# Patient Record
Sex: Female | Born: 1975 | Race: White | Hispanic: No | Marital: Single | Smoking: Never smoker
Health system: Southern US, Community
[De-identification: ages and names within clinical notes are randomized; demographics above are authoritative.]

## PROBLEM LIST (undated history)

## (undated) DIAGNOSIS — M1990 Unspecified osteoarthritis, unspecified site: Secondary | ICD-10-CM

## (undated) DIAGNOSIS — F53 Postpartum depression: Secondary | ICD-10-CM

## (undated) DIAGNOSIS — O99345 Other mental disorders complicating the puerperium: Secondary | ICD-10-CM

## (undated) DIAGNOSIS — A4902 Methicillin resistant Staphylococcus aureus infection, unspecified site: Secondary | ICD-10-CM

## (undated) DIAGNOSIS — R519 Headache, unspecified: Secondary | ICD-10-CM

## (undated) DIAGNOSIS — R51 Headache: Secondary | ICD-10-CM

## (undated) DIAGNOSIS — D649 Anemia, unspecified: Secondary | ICD-10-CM

## (undated) DIAGNOSIS — R569 Unspecified convulsions: Secondary | ICD-10-CM

## (undated) HISTORY — PX: LIPOSUCTION: SHX10

## (undated) HISTORY — PX: KNEE SURGERY: SHX244

## (undated) HISTORY — PX: TUBAL LIGATION: SHX77

---

## 1998-08-20 ENCOUNTER — Ambulatory Visit (HOSPITAL_COMMUNITY): Admission: RE | Admit: 1998-08-20 | Discharge: 1998-08-20 | Payer: Self-pay | Admitting: Neurology

## 1998-08-20 ENCOUNTER — Encounter: Payer: Self-pay | Admitting: Neurology

## 1999-10-11 ENCOUNTER — Encounter: Payer: Self-pay | Admitting: Emergency Medicine

## 1999-10-11 ENCOUNTER — Emergency Department (HOSPITAL_COMMUNITY): Admission: EM | Admit: 1999-10-11 | Discharge: 1999-10-11 | Payer: Self-pay

## 2000-09-24 ENCOUNTER — Encounter: Admission: RE | Admit: 2000-09-24 | Discharge: 2000-09-24 | Payer: Self-pay | Admitting: Otolaryngology

## 2000-09-24 ENCOUNTER — Encounter: Payer: Self-pay | Admitting: Otolaryngology

## 2001-08-11 ENCOUNTER — Emergency Department (HOSPITAL_COMMUNITY): Admission: EM | Admit: 2001-08-11 | Discharge: 2001-08-11 | Payer: Self-pay | Admitting: Emergency Medicine

## 2001-08-11 ENCOUNTER — Encounter: Payer: Self-pay | Admitting: Emergency Medicine

## 2001-08-13 ENCOUNTER — Inpatient Hospital Stay (HOSPITAL_COMMUNITY): Admission: AD | Admit: 2001-08-13 | Discharge: 2001-08-13 | Payer: Self-pay | Admitting: *Deleted

## 2001-09-02 ENCOUNTER — Observation Stay (HOSPITAL_COMMUNITY): Admission: AD | Admit: 2001-09-02 | Discharge: 2001-09-02 | Payer: Self-pay | Admitting: Obstetrics and Gynecology

## 2001-09-17 ENCOUNTER — Observation Stay (HOSPITAL_COMMUNITY): Admission: AD | Admit: 2001-09-17 | Discharge: 2001-09-17 | Payer: Self-pay | Admitting: Obstetrics & Gynecology

## 2001-09-18 ENCOUNTER — Other Ambulatory Visit: Admission: RE | Admit: 2001-09-18 | Discharge: 2001-09-18 | Payer: Self-pay | Admitting: Obstetrics and Gynecology

## 2001-09-18 ENCOUNTER — Observation Stay (HOSPITAL_COMMUNITY): Admission: AD | Admit: 2001-09-18 | Discharge: 2001-09-19 | Payer: Self-pay | Admitting: Obstetrics and Gynecology

## 2001-11-22 ENCOUNTER — Ambulatory Visit (HOSPITAL_COMMUNITY): Admission: RE | Admit: 2001-11-22 | Discharge: 2001-11-22 | Payer: Self-pay | Admitting: Obstetrics and Gynecology

## 2001-12-03 ENCOUNTER — Encounter: Payer: Self-pay | Admitting: Obstetrics and Gynecology

## 2001-12-03 ENCOUNTER — Inpatient Hospital Stay (HOSPITAL_COMMUNITY): Admission: AD | Admit: 2001-12-03 | Discharge: 2001-12-03 | Payer: Self-pay | Admitting: Obstetrics and Gynecology

## 2002-01-10 ENCOUNTER — Inpatient Hospital Stay (HOSPITAL_COMMUNITY): Admission: AD | Admit: 2002-01-10 | Discharge: 2002-01-10 | Payer: Self-pay | Admitting: Obstetrics and Gynecology

## 2002-01-10 ENCOUNTER — Encounter: Payer: Self-pay | Admitting: Obstetrics and Gynecology

## 2002-01-17 ENCOUNTER — Ambulatory Visit (HOSPITAL_COMMUNITY): Admission: RE | Admit: 2002-01-17 | Discharge: 2002-01-17 | Payer: Self-pay | Admitting: Obstetrics and Gynecology

## 2002-01-18 ENCOUNTER — Inpatient Hospital Stay (HOSPITAL_COMMUNITY): Admission: AD | Admit: 2002-01-18 | Discharge: 2002-01-18 | Payer: Self-pay | Admitting: Obstetrics & Gynecology

## 2002-02-18 ENCOUNTER — Inpatient Hospital Stay (HOSPITAL_COMMUNITY): Admission: AD | Admit: 2002-02-18 | Discharge: 2002-02-20 | Payer: Self-pay | Admitting: *Deleted

## 2002-02-19 ENCOUNTER — Encounter: Payer: Self-pay | Admitting: *Deleted

## 2002-02-21 ENCOUNTER — Inpatient Hospital Stay (HOSPITAL_COMMUNITY): Admission: AD | Admit: 2002-02-21 | Discharge: 2002-02-24 | Payer: Self-pay | Admitting: Obstetrics and Gynecology

## 2002-02-21 ENCOUNTER — Encounter: Payer: Self-pay | Admitting: Obstetrics and Gynecology

## 2002-02-22 ENCOUNTER — Encounter: Payer: Self-pay | Admitting: Obstetrics and Gynecology

## 2002-02-22 ENCOUNTER — Encounter: Payer: Self-pay | Admitting: Cardiology

## 2002-02-24 ENCOUNTER — Encounter: Payer: Self-pay | Admitting: Obstetrics and Gynecology

## 2002-03-01 ENCOUNTER — Inpatient Hospital Stay (HOSPITAL_COMMUNITY): Admission: AD | Admit: 2002-03-01 | Discharge: 2002-03-01 | Payer: Self-pay | Admitting: Obstetrics & Gynecology

## 2002-03-03 ENCOUNTER — Inpatient Hospital Stay (HOSPITAL_COMMUNITY): Admission: AD | Admit: 2002-03-03 | Discharge: 2002-03-03 | Payer: Self-pay | Admitting: *Deleted

## 2002-03-05 ENCOUNTER — Encounter (INDEPENDENT_AMBULATORY_CARE_PROVIDER_SITE_OTHER): Payer: Self-pay | Admitting: Specialist

## 2002-03-05 ENCOUNTER — Inpatient Hospital Stay (HOSPITAL_COMMUNITY): Admission: AD | Admit: 2002-03-05 | Discharge: 2002-03-07 | Payer: Self-pay | Admitting: Obstetrics and Gynecology

## 2002-03-09 ENCOUNTER — Encounter: Admission: RE | Admit: 2002-03-09 | Discharge: 2002-04-08 | Payer: Self-pay | Admitting: Obstetrics and Gynecology

## 2002-04-07 ENCOUNTER — Other Ambulatory Visit: Admission: RE | Admit: 2002-04-07 | Discharge: 2002-04-07 | Payer: Self-pay | Admitting: Obstetrics and Gynecology

## 2002-10-30 ENCOUNTER — Emergency Department (HOSPITAL_COMMUNITY): Admission: EM | Admit: 2002-10-30 | Discharge: 2002-10-30 | Payer: Self-pay | Admitting: Emergency Medicine

## 2004-09-04 ENCOUNTER — Ambulatory Visit (HOSPITAL_COMMUNITY): Admission: RE | Admit: 2004-09-04 | Discharge: 2004-09-04 | Payer: Self-pay | Admitting: *Deleted

## 2005-08-17 ENCOUNTER — Encounter (INDEPENDENT_AMBULATORY_CARE_PROVIDER_SITE_OTHER): Payer: Self-pay | Admitting: *Deleted

## 2005-08-17 ENCOUNTER — Ambulatory Visit (HOSPITAL_COMMUNITY): Admission: RE | Admit: 2005-08-17 | Discharge: 2005-08-17 | Payer: Self-pay | Admitting: Obstetrics and Gynecology

## 2006-01-11 ENCOUNTER — Emergency Department (HOSPITAL_COMMUNITY): Admission: EM | Admit: 2006-01-11 | Discharge: 2006-01-12 | Payer: Self-pay | Admitting: Emergency Medicine

## 2007-10-29 ENCOUNTER — Ambulatory Visit: Payer: Self-pay | Admitting: Family Medicine

## 2008-02-23 ENCOUNTER — Ambulatory Visit: Payer: Self-pay | Admitting: Internal Medicine

## 2008-04-14 ENCOUNTER — Encounter: Admission: RE | Admit: 2008-04-14 | Discharge: 2008-04-14 | Payer: Self-pay | Admitting: Psychiatry

## 2009-01-24 ENCOUNTER — Ambulatory Visit: Payer: Self-pay | Admitting: Internal Medicine

## 2009-04-25 ENCOUNTER — Ambulatory Visit: Payer: Self-pay | Admitting: Internal Medicine

## 2009-06-06 ENCOUNTER — Ambulatory Visit: Payer: Self-pay | Admitting: Internal Medicine

## 2009-08-30 ENCOUNTER — Encounter: Payer: Self-pay | Admitting: Maternal and Fetal Medicine

## 2009-09-16 ENCOUNTER — Encounter: Payer: Self-pay | Admitting: Maternal & Fetal Medicine

## 2009-11-13 ENCOUNTER — Ambulatory Visit: Payer: Self-pay | Admitting: Internal Medicine

## 2009-12-16 ENCOUNTER — Observation Stay: Payer: Self-pay

## 2009-12-22 ENCOUNTER — Observation Stay: Payer: Self-pay

## 2010-01-13 ENCOUNTER — Observation Stay: Payer: Self-pay | Admitting: Obstetrics and Gynecology

## 2010-01-30 ENCOUNTER — Encounter: Payer: Self-pay | Admitting: Family Medicine

## 2010-04-29 ENCOUNTER — Ambulatory Visit: Payer: Self-pay | Admitting: Internal Medicine

## 2010-05-20 ENCOUNTER — Ambulatory Visit: Payer: Self-pay | Admitting: Ophthalmology

## 2010-05-27 NOTE — H&P (Signed)
NAME:  Alison Maxwell, Alison Maxwell                             ACCOUNT NO.:  [id number]   MEDICAL RECORD NO.:  [id number]                   PATIENT TYPE:  OBV   LOCATION:  9320                                 [facility]ILITY:  [facility]   PHYSICIAN:  Guy Sandifer. Arleta Creek, M.D.           DATE OF BIRTH:  [birth year]   DATE OF ADMISSION:  09/18/2001  DATE OF DISCHARGE:                                HISTORY & PHYSICAL   CHIEF COMPLAINT:  Hyperemesis gravidarum.   HISTORY OF PRESENT ILLNESS:  This patient is a 35 year old married white  female, G3, P0, AB2, with an LMP of  July 05, 2001, and an [facility] of April 12, 2002, confirmed by a 6-1/2 week ultrasound, placing her at 11-0/7 weeks  estimated gestational age.  She has had persistent problems with nausea and  vomiting this pregnancy.  This has been refractory to Reglan, Phenergan, and  Zofran.  She had heavy diarrhea last evening which required her second round  of IV fluids.  So far today she has still not retained any oral intake,  including fluids or food.  She is being admitted for management with IV  fluids and antiemetics.   PAST MEDICAL HISTORY:  Abnormal Pap, 1997.   PAST SURGICAL HISTORY:  1. Knee surgery.  2. Liposuction.   OBSTETRIC HISTORY:  D&C x2.   FAMILY HISTORY:  Positive for hypertension in father, renal disease in  maternal grandfather and mother, lung and testicular cancer in father.   SOCIAL HISTORY:  The patient denies tobacco, alcohol, or drug abuse.   REVIEW OF SYSTEMS:  Negative except as above.   PHYSICAL EXAMINATION:  VITAL SIGNS:  Height 5 feet 1 inch, weight 138-1/2  pounds, blood pressure 94/64.  HEENT:  Without thyromegaly.  LUNGS:  Clear to auscultation.  HEART:  Regular rate and rhythm.  BACK:  Without CVA tenderness.  BREASTS:  Not examined.  ABDOMEN:  Soft, nontender without masses.  PELVIC:  Vulva, vagina, and cervix without lesions.  Uterus is 10-12 weeks  in size.  Adnexa nontender without masses.  Cervix is closed,  thick, and  high.  Fetal heart tones are auscultated.  EXTREMITIES/NEUROLOGIC:  Grossly within normal limits.   LABORATORY DATA:  Blood type is O negative.  Rh antibody screen is positive  for anti-D, status post RhoGAM.  VDRL nonreactive.  Rubella titer immune.  Hepatitis B surface antigen negative.    ASSESSMENT:  Hyperemesis gravidarum at 11 weeks' estimated gestational age.   PLAN:  IV fluids, antiemetics p.r.n.  Reevaluation in a.m.                                                 Guy Sandifer Arleta Creek, M.D.    JET/MEDQ  D:  09/18/2001  T:  09/18/2001  Job:  [id number]

## 2010-05-27 NOTE — Op Note (Signed)
   NAME:  Maxwell, Alison STRINE                           ACCOUNT NO.:  [id number]   MEDICAL RECORD NO.:  [id number]                   PATIENT TYPE:  INP   LOCATION:  9325                                 [facility]   PHYSICIAN:  Guy Sandifer. Arleta Creek, M.D.           DATE OF BIRTH:  [birth year]   DATE OF PROCEDURE:  03/05/2002  DATE OF DISCHARGE:                                 OPERATIVE REPORT   PROCEDURE:  Resection of vulvar lesions.   INDICATIONS AND CONSENT:  This patient is a 35 year old married white female  who is in active labor.  She has two condylomatous-type lesions measuring 8-  12 mm at the 6 and 8 o'clock positions at the vaginal introitus.  After  discussing with the patient, she would like these taken off.   DESCRIPTION OF PROCEDURE:  The patient has epidural anesthesia in for  delivery.  She is presently pushing, and the baby is beginning to crown.  She has been prepped in a sterile fashion for delivery.  Simple resection  with the scissors is used to remove these lesions, which are sent to  pathology.                                               Guy Sandifer Arleta Creek, M.D.    JET/MEDQ  D:  03/05/2002  T:  03/06/2002  Job:  [id number]

## 2010-05-27 NOTE — Op Note (Signed)
NAME:  Alison Maxwell, Alison Maxwell                 ACCOUNT NO.:  [id number]   MEDICAL RECORD NO.:  [id number]          PATIENT TYPE:  AMB   LOCATION:  SDC                           [facility]   PHYSICIAN:  Malva Limes, M.D.    DATE OF BIRTH:  18-Dec-1975   DATE OF PROCEDURE:  08/17/2005  DATE OF DISCHARGE:                                 OPERATIVE REPORT   PREOPERATIVE DIAGNOSIS:  Spontaneous abortion.   POSTOPERATIVE DIAGNOSIS:  Spontaneous abortion.   PROCEDURE:  Dilatation and curettage.   SURGEON:  Malva Limes, M.D.   ANESTHESIA:  MAC with paracervical block.   DRAINS:  None.   ANTIBIOTICS:  Ancef 1 gram.   ESTIMATED BLOOD LOSS:  30 mL.   SPECIMEN:  Products of conception sent to pathology.   COMPLICATIONS:  None.   PROCEDURE:  The patient was taken to the operating room where she was placed  in dorsal lithotomy position.  MAC anesthesia was administered without  complications.  She was then prepped with Betadine and draped in the usual  fashion for this procedure.  A sterile speculum was placed in the vagina.  Twenty mL of 1% lidocaine was used for paracervical block.  The patient then  had her cervix serially dilated.  An 8 mm suction cannula was placed into  the uterine cavity and products of conception were withdrawn.  Sharp  curettage was then performed followed by repeat suction.  The patient  tolerated the procedure well.  She will be discharged to home.  She will be  given RhoGAM prior to discharge.  The patient will be given Darvocet to take  p.r.n.           ______________________________  Malva Limes, M.D.     MA/MEDQ  D:  08/17/2005  T:  08/17/2005  Job:  [id number]

## 2010-05-27 NOTE — Discharge Summary (Signed)
NAME:  Alison Maxwell, Alison Maxwell                           ACCOUNT NO.:  [id number]   MEDICAL RECORD NO.:  [id number]                   PATIENT TYPE:  INP   LOCATION:  9128                                 [facility]   PHYSICIAN:  Duke Salvia. Marcelle Overlie, M.D.            DATE OF BIRTH:  12-10-1975   DATE OF ADMISSION:  02/21/2002  DATE OF DISCHARGE:  02/24/2002                                 DISCHARGE SUMMARY   DISCHARGE DIAGNOSES:  1. A 33-week intrauterine pregnancy.  2. Preterm labor.  3. Pulmonary edema.   HISTORY OF PRESENT ILLNESS:  For summary of the admission history and  physical examination, please see admission H&P for details.  Briefly, a 24-  year-old G3, P0, A2, EDD April 3 at 33 weeks previously hospitalized for  preterm labor.  Treated with magnesium sulfate and steroids IM.  She was  discharged on the day prior to admission on oral terbutaline.  Presents now  with shortness of breath and contractions.  Chest x-ray showed mild  cardiomegaly with vascular congestion and small pleural effusion.  Admitted  now to treat these symptoms.   HOSPITAL COURSE:  On initial evaluation in triage was noted to be 2 cm, 50%,  vertex.  ABG showed O2 86 on room air, CO2 24.  An IV was started and she  was given IV Lasix with good response in urine output and improved  respiratory rate.  Was noted on February 14 to have mild elevation of SGOT  and SGPT.  She received incentive spirometry and was on IV Unasyn.  She had  improvement in her breathing after several doses of Lasix.  Admission WBC  was 17.3.  At discharge on February 16 WBC was 9.0.  February 13 the  admission chest x-ray showed vascular congestion with small effusions and  lower lobe atelectasis.  On February 14 it was read as no significant change  with mild edema.   OB ultrasound on February 11 showed a single vertex fetus.  Cervical length  was noted to be shortened.  Amniotic fluid volume was normal.  Cervical  length was 2.4  cm.  Cardiac echocardiogram was ordered on February 14.  This  was read as normal with normal left ventricular function.  Repeat laboratory  work on February 15 WBC 14,600, platelets 164,000, SGOT 31, SGPT 34 both of  which were normal.  Pulmonary examination was much improved at that point  and her breathing was clinically improved also.  She had resolution of her  preterm labor with only rare contractions.   By February 16 lungs were clear.  She had slight decreased breath sounds in  the right lower lobe.  Liver function tests repeat were normal.  Follow-up  chest films showed near complete resolution of bilateral pleural effusions  and bibasilar pulmonary opacities since prior study.   Other laboratory data:  CBC on discharge February 16:  WBC  9.0, hemoglobin  11.3, hematocrit 33.0, platelet count 257,000.  CMET on admission sodium  134, potassium 3.4, BUN 4.  AST 56, ALT 41.  CMET on discharge sodium 132,  BUN 5, calcium 8.2, AST 21, ALT 26 which were normal.  RPR was nonreactive.   DISPOSITION:  The patient is discharged on modified bed rest at home.  Advised to report any fever of 101, increased difficulty with shortness of  breath or more than four to five contractions per hour.  Preterm labor signs  and symptoms were reviewed.  She will return to our office in two days for  follow-up evaluation.    CONDITION ON DISCHARGE:  Good.   ACTIVITY:  Modified bed rest at home.                                               Richard M. Marcelle Overlie, M.D.    RMH/MEDQ  D:  04/30/2002  T:  04/30/2002  Job:  [id number]

## 2010-05-27 NOTE — H&P (Signed)
   NAME:  [name], [name]                           ACCOUNT NO.:  [id number]   MEDICAL RECORD NO.:  [id number]                   PATIENT TYPE:  INP   LOCATION:  9152                                 [facility]   PHYSICIAN:  Tracie Harrier, M.D.              DATE OF BIRTH:  [birth year]   DATE OF ADMISSION:  02/18/2002  DATE OF DISCHARGE:                                HISTORY & PHYSICAL   HISTORY OF PRESENT ILLNESS:  [name] is a 35 year old female, G3, P0, A2,  at 40 and 3/7ths weeks gestation.  She was admitted after being seen in  maternity admissions for increased contractions.  The patient was treated  initially conservatively with subcutaneous terbutaline and p.o. hydration.  This was unsuccessful.  She was dilated 1-2 cm, 50%, per nurse examiner.  She is admitted for magnesium intravenous tocolysis.   Her pregnancy has been complicated by preterm labor which has been treated  with bed rest.   OB LABS:  Maternal blood type O-, rubella immune, glucola normal, group B  strep unknown.   MEDICAL HISTORY:  None.   SURGICAL HISTORY:  1. Knee surgery.  2. Liposuction.  3. D&C x1.   OB HISTORY:  SAB x2.   CURRENT MEDICATIONS:  Prenatal vitamins.   ALLERGIES:  ERYTHROMYCIN AND SULFA.   SOCIAL HISTORY:  Negative for smoking, alcohol usage, or illicit drug usage.   PHYSICAL EXAMINATION:  VITAL SIGNS:  Stable.  Temperature 98.0, blood  pressure 111/63, fetal heart tones 140 and reassuring.  GENERAL:  She is a well-developed, well-nourished female in no acute  distress.  HEENT:  Within normal limits.  NECK:  Supple without adenopathy or thyromegaly.  HEART:  Regular rate and rhythm without murmurs, gallops or rubs.  LUNGS:  Clear.  BREAST EXAM:  Deferred.  ABDOMEN:  Gravid and nontender.  EXTREMITIES:  Neurologic grossly normal.  PELVIC:  Normal external female genitalia.  Vagina clear.  Cervical exam is  1-2 cm, 50% per nurse examiner.   ADMITTING DIAGNOSIS:  1.  Intrauterine pregnancy at 79 and 3/7ths weeks.  2. Preterm labor.    PLAN:  1. Intravenous magnesium tocolysis.  2. Betamethasone.                                               Tracie Harrier, M.D.    REG/MEDQ  D:  02/18/2002  T:  02/18/2002  Job:  [id number]

## 2010-07-02 ENCOUNTER — Ambulatory Visit: Payer: Self-pay | Admitting: Internal Medicine

## 2010-07-06 ENCOUNTER — Ambulatory Visit: Payer: Self-pay | Admitting: Family Medicine

## 2010-07-24 ENCOUNTER — Ambulatory Visit: Payer: Self-pay | Admitting: Family Medicine

## 2010-08-28 ENCOUNTER — Ambulatory Visit: Payer: Self-pay | Admitting: Internal Medicine

## 2010-08-28 ENCOUNTER — Inpatient Hospital Stay: Payer: Self-pay | Admitting: Internal Medicine

## 2010-09-08 ENCOUNTER — Ambulatory Visit: Payer: Self-pay | Admitting: Pain Medicine

## 2010-09-10 ENCOUNTER — Ambulatory Visit: Payer: Self-pay | Admitting: Internal Medicine

## 2010-09-26 ENCOUNTER — Ambulatory Visit: Payer: Self-pay | Admitting: Pain Medicine

## 2010-10-02 ENCOUNTER — Ambulatory Visit: Payer: Self-pay

## 2010-10-18 ENCOUNTER — Ambulatory Visit: Payer: Self-pay | Admitting: Internal Medicine

## 2010-12-03 ENCOUNTER — Ambulatory Visit: Payer: Self-pay | Admitting: Internal Medicine

## 2010-12-19 ENCOUNTER — Ambulatory Visit: Payer: Self-pay

## 2011-01-28 ENCOUNTER — Ambulatory Visit: Payer: Self-pay

## 2011-03-30 ENCOUNTER — Ambulatory Visit: Payer: Self-pay | Admitting: Family Medicine

## 2011-04-28 ENCOUNTER — Ambulatory Visit: Payer: Self-pay | Admitting: Internal Medicine

## 2011-04-30 ENCOUNTER — Ambulatory Visit: Payer: Self-pay

## 2011-09-05 ENCOUNTER — Ambulatory Visit: Payer: Self-pay | Admitting: Physician Assistant

## 2011-10-08 ENCOUNTER — Emergency Department: Payer: Self-pay | Admitting: Emergency Medicine

## 2011-10-08 LAB — URINALYSIS, COMPLETE
Glucose,UR: NEGATIVE mg/dL (ref 0–75)
Nitrite: NEGATIVE
Ph: 6 (ref 4.5–8.0)
Protein: NEGATIVE
RBC,UR: 5 /HPF (ref 0–5)
Squamous Epithelial: 2

## 2011-10-08 LAB — CBC
HCT: 39.7 % (ref 35.0–47.0)
MCH: 30.7 pg (ref 26.0–34.0)
MCHC: 33.3 g/dL (ref 32.0–36.0)
MCV: 92 fL (ref 80–100)
Platelet: 368 10*3/uL (ref 150–440)
RBC: 4.31 10*6/uL (ref 3.80–5.20)
RDW: 13.8 % (ref 11.5–14.5)

## 2011-10-08 LAB — COMPREHENSIVE METABOLIC PANEL
Albumin: 3.8 g/dL (ref 3.4–5.0)
Anion Gap: 11 (ref 7–16)
BUN: 14 mg/dL (ref 7–18)
Bilirubin,Total: 0.2 mg/dL (ref 0.2–1.0)
Calcium, Total: 8.6 mg/dL (ref 8.5–10.1)
EGFR (African American): >60
EGFR (Non-African Amer.): >60
Glucose: 93 mg/dL (ref 65–99)
Osmolality: 287 (ref 275–301)
Potassium: 4.3 mmol/L (ref 3.5–5.1)
SGOT(AST): 25 U/L (ref 15–37)
SGPT (ALT): 25 U/L (ref 12–78)
Sodium: 144 mmol/L (ref 136–145)

## 2011-10-08 LAB — DRUG SCREEN, URINE
Barbiturates, Ur Screen: POSITIVE (ref ?–200)
MDMA (Ecstasy)Ur Screen: NEGATIVE (ref ?–500)
Methadone, Ur Screen: NEGATIVE (ref ?–300)
Opiate, Ur Screen: NEGATIVE (ref ?–300)
Phencyclidine (PCP) Ur S: NEGATIVE (ref ?–25)

## 2011-10-08 LAB — PREGNANCY, URINE: Pregnancy Test, Urine: NEGATIVE m[IU]/mL

## 2011-10-08 LAB — TROPONIN I: Troponin-I: <0.02 ng/mL

## 2011-11-07 ENCOUNTER — Ambulatory Visit: Payer: Self-pay | Admitting: Internal Medicine

## 2011-11-13 ENCOUNTER — Ambulatory Visit: Payer: Self-pay | Admitting: Medical

## 2011-11-15 ENCOUNTER — Observation Stay: Payer: Self-pay | Admitting: Internal Medicine

## 2011-11-15 LAB — HEPATIC FUNCTION PANEL A (ARMC)
Albumin: 3.9 g/dL (ref 3.4–5.0)
Bilirubin,Total: 0.2 mg/dL (ref 0.2–1.0)
SGOT(AST): 28 U/L (ref 15–37)
SGPT (ALT): 38 U/L (ref 12–78)
Total Protein: 8.3 g/dL — ABNORMAL HIGH (ref 6.4–8.2)

## 2011-11-15 LAB — URINALYSIS, COMPLETE
Glucose,UR: NEGATIVE mg/dL (ref 0–75)
Hyaline Cast: 3
Ketone: NEGATIVE
Leukocyte Esterase: NEGATIVE
Nitrite: POSITIVE
Protein: 100
RBC,UR: 1 /HPF (ref 0–5)
Specific Gravity: 1.019 (ref 1.003–1.030)
Squamous Epithelial: 1
WBC UR: 3 /HPF (ref 0–5)

## 2011-11-15 LAB — BASIC METABOLIC PANEL
BUN: 11 mg/dL (ref 7–18)
Calcium, Total: 9.1 mg/dL (ref 8.5–10.1)
Chloride: 102 mmol/L (ref 98–107)
Creatinine: 0.83 mg/dL (ref 0.60–1.30)
EGFR (African American): >60
EGFR (Non-African Amer.): >60
Glucose: 96 mg/dL (ref 65–99)
Potassium: 3.8 mmol/L (ref 3.5–5.1)
Sodium: 138 mmol/L (ref 136–145)

## 2011-11-15 LAB — DRUG SCREEN, URINE
Barbiturates, Ur Screen: NEGATIVE (ref ?–200)
Cannabinoid 50 Ng, Ur ~~LOC~~: NEGATIVE (ref ?–50)
Cocaine Metabolite,Ur ~~LOC~~: NEGATIVE (ref ?–300)
Opiate, Ur Screen: NEGATIVE (ref ?–300)
Phencyclidine (PCP) Ur S: NEGATIVE (ref ?–25)

## 2011-11-15 LAB — CBC WITH DIFFERENTIAL/PLATELET
Basophil %: 0.3 %
Eosinophil %: 0.6 %
HCT: 44.5 % (ref 35.0–47.0)
HGB: 14.8 g/dL (ref 12.0–16.0)
Lymphocyte #: 2.1 10*3/uL (ref 1.0–3.6)
Lymphocyte %: 18 %
MCH: 30.9 pg (ref 26.0–34.0)
MCHC: 33.3 g/dL (ref 32.0–36.0)
Neutrophil %: 74.9 %
Platelet: 375 10*3/uL (ref 150–440)
RBC: 4.8 10*6/uL (ref 3.80–5.20)
WBC: 11.5 10*3/uL — ABNORMAL HIGH (ref 3.6–11.0)

## 2011-11-15 LAB — PREGNANCY, URINE: Pregnancy Test, Urine: NEGATIVE m[IU]/mL

## 2011-11-15 LAB — ACETAMINOPHEN LEVEL: Acetaminophen: <2 ug/mL

## 2011-11-15 LAB — MAGNESIUM: Magnesium: 2.2 mg/dL

## 2011-11-16 LAB — PHENYTOIN LEVEL, TOTAL
Dilantin: 0.8 ug/mL — ABNORMAL LOW (ref 10.0–20.0)
Dilantin: 3.9 ug/mL — ABNORMAL LOW (ref 10.0–20.0)

## 2011-11-17 LAB — PHENYTOIN LEVEL, TOTAL: Dilantin: 12 ug/mL (ref 10.0–20.0)

## 2011-12-11 ENCOUNTER — Emergency Department: Payer: Self-pay

## 2011-12-11 LAB — URINALYSIS, COMPLETE
Nitrite: NEGATIVE
Ph: 6 (ref 4.5–8.0)
Protein: NEGATIVE
RBC,UR: 4 /HPF (ref 0–5)
WBC UR: 1 /HPF (ref 0–5)

## 2011-12-11 LAB — DRUG SCREEN, URINE
Amphetamines, Ur Screen: NEGATIVE (ref ?–1000)
Cocaine Metabolite,Ur ~~LOC~~: NEGATIVE (ref ?–300)
MDMA (Ecstasy)Ur Screen: NEGATIVE (ref ?–500)
Methadone, Ur Screen: NEGATIVE (ref ?–300)
Tricyclic, Ur Screen: NEGATIVE (ref ?–1000)

## 2011-12-11 LAB — BASIC METABOLIC PANEL
BUN: 12 mg/dL (ref 7–18)
Calcium, Total: 9.5 mg/dL (ref 8.5–10.1)
EGFR (African American): >60
EGFR (Non-African Amer.): >60
Glucose: 66 mg/dL (ref 65–99)
Osmolality: 275 (ref 275–301)
Potassium: 4 mmol/L (ref 3.5–5.1)
Sodium: 139 mmol/L (ref 136–145)

## 2011-12-11 LAB — CBC
HCT: 42.8 % (ref 35.0–47.0)
HGB: 14.6 g/dL (ref 12.0–16.0)
MCV: 92 fL (ref 80–100)
RDW: 13.9 % (ref 11.5–14.5)

## 2011-12-11 LAB — PHENYTOIN LEVEL, TOTAL: Dilantin: 1.8 ug/mL — ABNORMAL LOW (ref 10.0–20.0)

## 2011-12-19 ENCOUNTER — Inpatient Hospital Stay: Payer: Self-pay | Admitting: Specialist

## 2011-12-19 LAB — CBC WITH DIFFERENTIAL/PLATELET
Basophil #: 0.1 10*3/uL (ref 0.0–0.1)
Eosinophil #: 0.1 10*3/uL (ref 0.0–0.7)
HCT: 37.5 % (ref 35.0–47.0)
HGB: 12.4 g/dL (ref 12.0–16.0)
Lymphocyte #: 1.8 10*3/uL (ref 1.0–3.6)
MCH: 31 pg (ref 26.0–34.0)
Monocyte #: 0.7 x10 3/mm (ref 0.2–0.9)
Monocyte %: 5.8 %
Platelet: 298 10*3/uL (ref 150–440)
RDW: 14.3 % (ref 11.5–14.5)
WBC: 12.8 10*3/uL — ABNORMAL HIGH (ref 3.6–11.0)

## 2011-12-19 LAB — DRUG SCREEN, URINE
Amphetamines, Ur Screen: NEGATIVE (ref ?–1000)
Benzodiazepine, Ur Scrn: NEGATIVE (ref ?–200)
Cannabinoid 50 Ng, Ur ~~LOC~~: NEGATIVE (ref ?–50)
MDMA (Ecstasy)Ur Screen: NEGATIVE (ref ?–500)

## 2011-12-19 LAB — COMPREHENSIVE METABOLIC PANEL
Alkaline Phosphatase: 76 U/L (ref 50–136)
Bilirubin,Total: 0.2 mg/dL (ref 0.2–1.0)
Calcium, Total: 8.8 mg/dL (ref 8.5–10.1)
Chloride: 107 mmol/L (ref 98–107)
Co2: 25 mmol/L (ref 21–32)
Creatinine: 0.49 mg/dL — ABNORMAL LOW (ref 0.60–1.30)
EGFR (African American): >60
EGFR (Non-African Amer.): >60
SGOT(AST): 22 U/L (ref 15–37)
Sodium: 140 mmol/L (ref 136–145)

## 2011-12-19 LAB — ETHANOL: Ethanol: <3 mg/dL

## 2011-12-20 LAB — URINALYSIS, COMPLETE
Bilirubin,UR: NEGATIVE
Blood: NEGATIVE
Glucose,UR: NEGATIVE mg/dL (ref 0–75)
Ketone: NEGATIVE

## 2011-12-20 LAB — CBC WITH DIFFERENTIAL/PLATELET
Basophil #: 0 10*3/uL (ref 0.0–0.1)
Eosinophil #: 0.2 10*3/uL (ref 0.0–0.7)
Lymphocyte #: 3.1 10*3/uL (ref 1.0–3.6)
Lymphocyte %: 33.8 %
MCHC: 33.1 g/dL (ref 32.0–36.0)
Monocyte %: 7.7 %
Neutrophil %: 55.8 %
Platelet: 292 10*3/uL (ref 150–440)
RDW: 14.2 % (ref 11.5–14.5)
WBC: 9.1 10*3/uL (ref 3.6–11.0)

## 2011-12-30 ENCOUNTER — Emergency Department: Payer: Self-pay | Admitting: Internal Medicine

## 2011-12-30 LAB — COMPREHENSIVE METABOLIC PANEL
Anion Gap: 9 (ref 7–16)
Chloride: 110 mmol/L — ABNORMAL HIGH (ref 98–107)
Co2: 20 mmol/L — ABNORMAL LOW (ref 21–32)
Creatinine: 0.81 mg/dL (ref 0.60–1.30)
EGFR (African American): >60
EGFR (Non-African Amer.): >60
Potassium: 3.5 mmol/L (ref 3.5–5.1)
Sodium: 139 mmol/L (ref 136–145)
Total Protein: 8 g/dL (ref 6.4–8.2)

## 2011-12-30 LAB — URINALYSIS, COMPLETE
Blood: NEGATIVE
Glucose,UR: NEGATIVE mg/dL (ref 0–75)
Nitrite: NEGATIVE
Ph: 6 (ref 4.5–8.0)
Protein: NEGATIVE
RBC,UR: 4 /HPF (ref 0–5)

## 2011-12-30 LAB — CBC
HCT: 40.1 % (ref 35.0–47.0)
HGB: 13.6 g/dL (ref 12.0–16.0)
MCHC: 34 g/dL (ref 32.0–36.0)
Platelet: 358 10*3/uL (ref 150–440)
RDW: 13.9 % (ref 11.5–14.5)
WBC: 7.8 10*3/uL (ref 3.6–11.0)

## 2011-12-30 LAB — DRUG SCREEN, URINE
Amphetamines, Ur Screen: NEGATIVE (ref ?–1000)
Benzodiazepine, Ur Scrn: POSITIVE (ref ?–200)
Cannabinoid 50 Ng, Ur ~~LOC~~: NEGATIVE (ref ?–50)
Cocaine Metabolite,Ur ~~LOC~~: NEGATIVE (ref ?–300)
MDMA (Ecstasy)Ur Screen: NEGATIVE (ref ?–500)
Opiate, Ur Screen: NEGATIVE (ref ?–300)
Phencyclidine (PCP) Ur S: NEGATIVE (ref ?–25)

## 2011-12-30 LAB — PREGNANCY, URINE: Pregnancy Test, Urine: NEGATIVE m[IU]/mL

## 2011-12-30 LAB — ETHANOL
Ethanol %: <0.003 % (ref 0.000–0.080)
Ethanol: <3 mg/dL

## 2011-12-30 LAB — TSH: Thyroid Stimulating Horm: 1.04 u[IU]/mL

## 2012-01-02 ENCOUNTER — Emergency Department: Payer: Self-pay | Admitting: Emergency Medicine

## 2012-01-06 ENCOUNTER — Ambulatory Visit: Payer: Self-pay | Admitting: Family Medicine

## 2012-03-11 ENCOUNTER — Emergency Department: Payer: Self-pay | Admitting: Emergency Medicine

## 2012-04-03 ENCOUNTER — Emergency Department: Payer: Self-pay

## 2012-04-03 LAB — CBC
HCT: 38 % (ref 35.0–47.0)
HGB: 12.2 g/dL (ref 12.0–16.0)
MCHC: 32.2 g/dL (ref 32.0–36.0)
MCV: 92 fL (ref 80–100)
RBC: 4.14 10*6/uL (ref 3.80–5.20)
RDW: 14.7 % — ABNORMAL HIGH (ref 11.5–14.5)

## 2012-04-03 LAB — COMPREHENSIVE METABOLIC PANEL
Albumin: 3.5 g/dL (ref 3.4–5.0)
BUN: 6 mg/dL — ABNORMAL LOW (ref 7–18)
Bilirubin,Total: 0.2 mg/dL (ref 0.2–1.0)
Calcium, Total: 8.4 mg/dL — ABNORMAL LOW (ref 8.5–10.1)
Chloride: 111 mmol/L — ABNORMAL HIGH (ref 98–107)
Creatinine: 0.72 mg/dL (ref 0.60–1.30)
EGFR (African American): >60
Glucose: 82 mg/dL (ref 65–99)
Osmolality: 282 (ref 275–301)
Potassium: 4 mmol/L (ref 3.5–5.1)
SGOT(AST): 27 U/L (ref 15–37)

## 2012-04-03 LAB — VALPROIC ACID LEVEL: Valproic Acid: 42 ug/mL — ABNORMAL LOW

## 2012-04-03 LAB — ETHANOL: Ethanol: <3 mg/dL

## 2012-09-30 ENCOUNTER — Emergency Department (HOSPITAL_COMMUNITY)
Admission: EM | Admit: 2012-09-30 | Discharge: 2012-10-01 | Disposition: A | Discharge status: Home / Self Care | Payer: Medicaid Other | Attending: Dermatology | Admitting: Dermatology

## 2012-09-30 ENCOUNTER — Encounter (HOSPITAL_COMMUNITY): Payer: Self-pay

## 2012-09-30 DIAGNOSIS — F411 Generalized anxiety disorder: Secondary | ICD-10-CM | POA: Insufficient documentation

## 2012-09-30 DIAGNOSIS — F329 Major depressive disorder, single episode, unspecified: Secondary | ICD-10-CM

## 2012-09-30 DIAGNOSIS — F3289 Other specified depressive episodes: Secondary | ICD-10-CM | POA: Insufficient documentation

## 2012-09-30 DIAGNOSIS — R45851 Suicidal ideations: Secondary | ICD-10-CM | POA: Insufficient documentation

## 2012-09-30 DIAGNOSIS — Z3202 Encounter for pregnancy test, result negative: Secondary | ICD-10-CM | POA: Insufficient documentation

## 2012-09-30 HISTORY — DX: Other mental disorders complicating the puerperium: O99.345

## 2012-09-30 HISTORY — DX: Postpartum depression: F53.0

## 2012-09-30 LAB — COMPREHENSIVE METABOLIC PANEL
ALT: 9 U/L (ref 0–35)
BUN: 12 mg/dL (ref 6–23)
CO2: 26 mEq/L (ref 19–32)
Calcium: 9.5 mg/dL (ref 8.4–10.5)
Creatinine, Ser: 0.62 mg/dL (ref 0.50–1.10)
GFR calc Af Amer: >90 mL/min (ref >90)
GFR calc non Af Amer: >90 mL/min (ref >90)
Glucose, Bld: 117 mg/dL — ABNORMAL HIGH (ref 70–99)
Potassium: 3.5 mEq/L (ref 3.5–5.1)
Sodium: 136 mEq/L (ref 135–145)
Total Protein: 7.6 g/dL (ref 6.0–8.3)

## 2012-09-30 LAB — RAPID URINE DRUG SCREEN, HOSP PERFORMED
Cocaine: NOT DETECTED
Opiates: NOT DETECTED

## 2012-09-30 LAB — CBC
Hemoglobin: 14.2 g/dL (ref 12.0–15.0)
MCH: 31 pg (ref 26.0–34.0)
MCHC: 33.9 g/dL (ref 30.0–36.0)
MCV: 91.5 fL (ref 78.0–100.0)
RBC: 4.58 MIL/uL (ref 3.87–5.11)

## 2012-09-30 LAB — ETHANOL: Alcohol, Ethyl (B): <11 mg/dL (ref 0–11)

## 2012-09-30 LAB — POCT PREGNANCY, URINE: Preg Test, Ur: NEGATIVE

## 2012-09-30 LAB — SALICYLATE LEVEL: Salicylate Lvl: <2 mg/dL — ABNORMAL LOW (ref 2.8–20.0)

## 2012-09-30 MED ORDER — IBUPROFEN 200 MG PO TABS
600.0000 mg | ORAL_TABLET | Freq: Three times a day (TID) | ORAL | Status: DC | PRN
  Administered 2012-09-30: 600 mg via ORAL
  Filled 2012-09-30: qty 3

## 2012-09-30 MED ORDER — DIVALPROEX SODIUM ER 500 MG PO TB24
500.0000 mg | ORAL_TABLET | Freq: Two times a day (BID) | ORAL | Status: DC
  Administered 2012-09-30 – 2012-10-01 (×2): 500 mg via ORAL
  Filled 2012-09-30 (×3): qty 1

## 2012-09-30 MED ORDER — ACETAMINOPHEN 325 MG PO TABS: 650.0000 mg | ORAL_TABLET | ORAL | Status: DC | PRN

## 2012-09-30 MED ORDER — ALUM & MAG HYDROXIDE-SIMETH 200-200-20 MG/5ML PO SUSP: 30.0000 mL | ORAL | Status: DC | PRN

## 2012-09-30 MED ORDER — ONDANSETRON HCL 4 MG PO TABS: 4.0000 mg | ORAL_TABLET | Freq: Three times a day (TID) | ORAL | Status: DC | PRN

## 2012-09-30 MED ORDER — ZOLPIDEM TARTRATE 5 MG PO TABS
5.0000 mg | ORAL_TABLET | Freq: Every evening | ORAL | Status: DC | PRN
  Administered 2012-09-30: 5 mg via ORAL
  Filled 2012-09-30: qty 1

## 2012-09-30 MED ORDER — NICOTINE 21 MG/24HR TD PT24: 21.0000 mg | MEDICATED_PATCH | Freq: Every day | TRANSDERMAL | Status: DC | PRN

## 2012-09-30 MED ORDER — LORAZEPAM 1 MG PO TABS
1.0000 mg | ORAL_TABLET | ORAL | Status: DC | PRN
  Administered 2012-09-30 – 2012-10-01 (×2): 1 mg via ORAL
  Filled 2012-09-30 (×2): qty 1

## 2012-09-30 NOTE — Progress Notes (Signed)
CSW spoke with EDP concerning pt reporting that she has epilepsy and had not taken her evening dosage of depakote.  Marva Panda, Theresia Majors  [phone]  .09/30/2012

## 2012-09-30 NOTE — ED Notes (Signed)
Pt states having suicidal thoughts since last month, states increased depression since February 22, 2022 since her father passed away. Pt states no plan but she also battles addiction with opiates, no use recently. Pt very tearful requesting help with her depression.

## 2012-09-30 NOTE — ED Provider Notes (Signed)
CSN: [id number]     Arrival date & time 09/30/12  1222 History  This chart was scribed for non-physician practitioner Arthor Captain, PA-C, working with Suzi Roots, MD by Dorothey Baseman, ED Scribe. This patient was seen in room [facility] and the patient's care was started at 1:11 PM.    Chief Complaint  Patient presents with  . Medical Clearance   The history is provided by the patient. No language interpreter was used.   HPI Comments: Alison Maxwell is a 37 y.o. female who presents to the Emergency Department requesting medical clearance for intermittent depression with associated anxiety onset 10 years ago after the birth of her first child and she states that she did not speak to a medical professional at that time. She reports that the depression has been exacerbated for the past 1.5 years following the passing of her father and marital problems. She reports a prior history of benzodiazepine abuse (Xanax, prescribed PRN) and states that she received help for the substance abuse in 2010, but that she occasionally goes on "medication binges." She denies any prior hospital admittance for depression. She reports associated suicidal ideation, but denies thinking of any types of plans to carry it out. She denies access to weapons. She reports family history of depression and other mental health disorders (Schizophrenia, bipolar disorder). She denies family history of alcoholism, drug abuse, or suicide. She denies nausea, vomiting, dysuria, or any other symptoms at this time. She reports a history of vomiting secondary to anxiety exacerbation.   Past Medical History  Diagnosis Date  . Postpartum depression    Past Surgical History  Procedure Laterality Date  . Cesarean section     No family history on file. History  Substance Use Topics  . Smoking status: Never Smoker   . Smokeless tobacco: Not on file  . Alcohol Use: No   OB History   Grav Para Term Preterm Abortions TAB SAB Ect Mult Living                  Review of Systems  Gastrointestinal: Negative for nausea and vomiting.  Genitourinary: Negative for dysuria.  Psychiatric/Behavioral: Positive for suicidal ideas. Negative for self-injury. The patient is nervous/anxious.   All other systems reviewed and are negative.    Allergies  Review of patient's allergies indicates not on file.  Home Medications  No current outpatient prescriptions on file.  Triage Vitals: BP 108/75  Pulse 93  Temp(Src) 98.3 F (36.8 C) (Oral)  Resp 16  SpO2 100%  LMP 09/16/2012  Physical Exam  Nursing note and vitals reviewed. Constitutional: She is oriented to person, place, and time. She appears well-developed and well-nourished.  Patient is tearful.   HENT:  Head: Normocephalic and atraumatic.  Eyes: Conjunctivae are normal.  Neck: Normal range of motion. Neck supple.  Cardiovascular: Normal rate, regular rhythm and normal heart sounds.   Pulmonary/Chest: Effort normal and breath sounds normal. No respiratory distress.  Musculoskeletal: Normal range of motion.  Neurological: She is alert and oriented to person, place, and time.  Skin: Skin is warm and dry.  Psychiatric: She has a normal mood and affect. Her behavior is normal.    ED Course  Procedures (including critical care time)  Medications  acetaminophen (TYLENOL) tablet 650 mg (not administered)  zolpidem (AMBIEN) tablet 5 mg (not administered)  ondansetron (ZOFRAN) tablet 4 mg (not administered)  alum & mag hydroxide-simeth (MAALOX/MYLANTA) 200-200-20 MG/5ML suspension 30 mL (not administered)  ibuprofen (ADVIL,MOTRIN) tablet 600  mg (not administered)  nicotine (NICODERM CQ - dosed in mg/24 hours) patch 21 mg (not administered)   DIAGNOSTIC STUDIES: Oxygen Saturation is 100% on room air, normal by my interpretation.    COORDINATION OF CARE: 1:21PM- Advised that labs will be monitored to ensure there are no pertinent medical issues. Will transfer patient to the  psych department for evaluation by a psychiatrist. Discussed the course of treatment that may occur while in the psych department. Discussed treatment plan with patient at bedside and patient verbalized agreement.     Labs Review Labs Reviewed  COMPREHENSIVE METABOLIC PANEL - Abnormal; Notable for the following:    Glucose, Bld 117 (*)    Total Bilirubin 0.2 (*)    All other components within normal limits  SALICYLATE LEVEL - Abnormal; Notable for the following:    Salicylate Lvl <2.0 (*)    All other components within normal limits  URINE RAPID DRUG SCREEN (HOSP PERFORMED) - Abnormal; Notable for the following:    Benzodiazepines POSITIVE (*)    All other components within normal limits  ACETAMINOPHEN LEVEL  CBC  ETHANOL  POCT PREGNANCY, URINE   Imaging Review No results found.  MDM  No diagnosis found. Patient here for substance abuse and depression. Appears safe for psych eval.  I personally performed the services described in this documentation, which was scribed in my presence. The recorded information has been reviewed and is accurate.     Arthor Captain, PA-C 10/03/12 2047

## 2012-09-30 NOTE — ED Notes (Signed)
Pt's belongings taken home by friend Darreld Mclean"). Pt verified this via phone call. Trina's phone number: [phone].

## 2012-09-30 NOTE — BH Assessment (Signed)
Assessment Note  Alison Maxwell is an 37 y.o. femalewho presents to the ED for evaluation of her depression. Pt reports she currently lives with her mother and pts three minor children.  Pt reports that she can go back to her residence once medically stable.  Pt reports that she has been having problems with depression for "about 10 years since my son was born but I hid it from everyone".  Pt reports that her depression has become increasing worse since her father death a year ago.  Pt reports " I feel like I'm on a rollercoaster". Pt reports that she is an only child and that she was "Daddy's girl".  Pt reports feeling sad and depressed "all of the time" and that "I can never tell when I'm going to cry, it could be a song, a picture, a place, something somebody says and I just start crying.  Pt presented as drowsy and tearful throughout the assessment.  Pt reports that she doesn't have a specific suicidal plan but often feels "maybe they will better off without me".  Pt reports that she has never attempted suicide before. Pt reports using opiates that she gets from friends "to numb the pain".   Pt reports "I'm tired of feeling this way and feel like I need something to get me back to the real Alison Maxwell".  Pt denies HI/AH/VH.  Pt reports that her appetite is unchanged "but I've never been a big eater".  Pt reports that she sleeps "about 2 hours a night, but with three kids, I have to get up and get going whether I feel like it or not".  Pt reports that she has epilepsy and that she currently has three DUIs due to seizures while driving.  Her court date is 12/03/12, but reports "it will probably be continued because they are waiting for lab information".  Pt reports that she takes 500mg  of Depakote in the am, and in the evening.    Pt reports that she has not received Outpatient tx for her [place] of opiates, but has been Inpatient at [facility] twice, once in 2010 and once in April 2014.  CSW deferred disposition until pt  can be seen by a psych extender to determine whether medication and outpatient referrals might be appropriate.  Axis I: Depressive Disorder NOS and Rule out Substance dependence Axis II: Deferred Axis III:  Past Medical History  Diagnosis Date  . Postpartum depression    Axis IV: other psychosocial or environmental problems and problems related to legal system/crime Axis V: 31-40 impairment in reality testing  Past Medical History:  Past Medical History  Diagnosis Date  . Postpartum depression     Past Surgical History  Procedure Laterality Date  . Cesarean section      Family History: No family history on file.  Social History:  reports that she has never smoked. She does not have any smokeless tobacco history on file. She reports that she does not drink alcohol or use illicit drugs.  Additional Social History:     CIWA: CIWA-Ar BP: 108/75 mmHg Pulse Rate: 93 COWS:    Allergies:  Allergies  Allergen Reactions  . Erythromycin Nausea And Vomiting and Rash  . Sulfa Antibiotics Nausea And Vomiting and Rash    Home Medications:  (Not in a hospital admission)  OB/GYN Status:  Patient's last menstrual period was 09/16/2012.  General Assessment Data Location of Assessment: [facility] ED Is this a Tele or Face-to-Face Assessment?: Face-to-Face Is this an  Initial Assessment or a Re-assessment for this encounter?: Initial Assessment Living Arrangements: Children;Parent Can pt return to current living arrangement?: Yes Admission Status: Voluntary Is patient capable of signing voluntary admission?: Yes Transfer from: Home Referral Source: Self/Family/Friend  Medical Screening Exam [facility] Walk-in ONLY) Medical Exam completed: Yes  [facility] Crisis Care Plan Living Arrangements: Children;Parent  Education Status Is patient currently in school?: No  Risk to self Suicidal Ideation: Yes-Currently Present Suicidal Intent: No-Not Currently/Within Last 6 Months Is patient at risk for  suicide?: Yes Suicidal Plan?: No-Not Currently/Within Last 6 Months Access to Means: Yes Specify Access to Suicidal Means:  (Pt has no plan but does have access to prescription meds) What has been your use of drugs/alcohol within the last 12 months?: Opiates Previous Attempts/Gestures: No How many times?: 0 Other Self Harm Risks: none Triggers for Past Attempts: None known Intentional Self Injurious Behavior: None Family Suicide History: No Recent stressful life event(s): Loss (Comment);Legal Issues Persecutory voices/beliefs?: No Depression: Yes Depression Symptoms: Insomnia;Tearfulness;Isolating;Fatigue;Feeling worthless/self pity Substance abuse history and/or treatment for substance abuse?: Yes  Risk to Others Homicidal Ideation: No Thoughts of Harm to Others: No Current Homicidal Intent: No Current Homicidal Plan: No Access to Homicidal Means: No Identified Victim: none History of harm to others?: No Assessment of Violence: None Noted Does patient have access to weapons?: No Criminal Charges Pending?: Yes Describe Pending Criminal Charges:  (Pt reports DUI as a result of having seizures) Does patient have a court date: Yes Court Date: 12/03/12  Psychosis Hallucinations: None noted Delusions: None noted  Mental Status Report Appear/Hygiene:  (unremarkable) Eye Contact: Poor Motor Activity: Freedom of movement Speech: Logical/coherent;Soft;Slow Level of Consciousness: Quiet/awake;Crying;Drowsy Mood: Depressed;Sad;Worthless, low self-esteem Affect: Depressed;Sad Anxiety Level: None Thought Processes: Coherent;Relevant Judgement: Impaired Orientation: Person;Place;Time;Situation Obsessive Compulsive Thoughts/Behaviors: None  Cognitive Functioning Concentration: Decreased Memory: Recent Intact;Remote Intact IQ: Average Insight: Fair Impulse Control: Fair Appetite: Fair Weight Loss: 0 Weight Gain: 0 Sleep: Decreased Total Hours of Sleep: 2 Vegetative  Symptoms: None  ADLScreening [facility] Assessment Services) Patient's cognitive ability adequate to safely complete daily activities?: Yes Patient able to express need for assistance with ADLs?: Yes Independently performs ADLs?: Yes (appropriate for developmental age)  Prior Inpatient Therapy Prior Inpatient Therapy: Yes Prior Therapy Dates: 2010/2014 Prior Therapy Facilty/Provider(s): [facility] Reason for Treatment: Opiates  Prior Outpatient Therapy Prior Outpatient Therapy: No Prior Therapy Dates: None Prior Therapy Facilty/Provider(s): None Reason for Treatment: None  ADL Screening (condition at time of admission) Patient's cognitive ability adequate to safely complete daily activities?: Yes Patient able to express need for assistance with ADLs?: Yes Independently performs ADLs?: Yes (appropriate for developmental age)                  Additional Information 1:1 In Past 12 Months?: No CIRT Risk: No Elopement Risk: No Does patient have medical clearance?: Yes     Disposition:  Disposition Initial Assessment Completed for this Encounter: Yes Disposition of Patient:  (Disposition pending psych consult) Other disposition(s): Other (Comment) (Disposition pending psych consult)  On Site Evaluation by:   Reviewed with Physician:    Lexine Baton 09/30/2012 6:24 PM

## 2012-09-30 NOTE — ED Notes (Signed)
1 white camisole placed in locker # 35.

## 2012-10-01 DIAGNOSIS — F329 Major depressive disorder, single episode, unspecified: Secondary | ICD-10-CM

## 2012-10-01 NOTE — Consult Note (Signed)
  Evaluation for inpatient treatment; Face to face interview and consulted with Dr.  Referred by:  EDP   HPI:  Patient presents with worsening depression related being unable to deal with grief of fathers death.  Patient denies suicidal ideation, homicidal ideation, psychosis, and paranoia.  Patient wants medication and help with depression.  Patient able to contract for safety.   @ Axis I: Depressive Disorder NOS Axis II: Deferred Axis III:  Past Medical History  Diagnosis Date  . Postpartum depression    Axis IV: other psychosocial or environmental problems and problems related to social environment Axis V: 51-60 moderate symptoms  @Psychiatric  Specialty Exam: @PHYSEXAMBYAGE2 @  @ROS @  Blood pressure 109/77, pulse 95, temperature 98.9 F (37.2 C), temperature source Oral, resp. rate 18, last menstrual period 09/16/2012, SpO2 100.00%.There is no height or weight on file to calculate BMI.  General Appearance: Fairly Groomed  Patent attorney::  Good  Speech:  Clear and Coherent and Normal Rate  Volume:  Normal  Mood:  Depressed  Affect:  Depressed  Thought Process:  Circumstantial and Goal Directed  Orientation:  Full (Time, Place, and Person)  Thought Content:  Rumination  Suicidal Thoughts:  No  Homicidal Thoughts:  No  Memory:  Immediate;   Good Recent;   Good Remote;   Good  Judgement:  Fair  Insight:  Fair  Psychomotor Activity:  Normal  Concentration:  Fair  Recall:  Good  Akathisia:  No  Handed:  Right  AIMS (if indicated):     Assets:  Communication Skills Desire for Improvement Social Support  Sleep:       Plan:  Disposition:  Discharge home with mother.  Follow up with scheduled outpatient services in [place].

## 2012-10-01 NOTE — Progress Notes (Addendum)
Per discussion with Psychiatrist patient is psychiatrically stable for discharge home. CSW provided patient with [facility]. They will contact patient and arrange appointment. Pt in agreement with plan.   Catha Gosselin, LCSW [phone]  ED CSW    Pt follow up appointment at [facility] at 10 am on 10/04/2012 that accepts patient medicaid.   Catha Gosselin, LCSW [phone]  ED CSW .10/01/2012 11:56am

## 2012-10-01 NOTE — [facility] Counselor (Signed)
Writer obtained a consent to release of information in order to speak to the consumers mother.  Writer informed the patients mother that she will be discharged with outpatient referrals and will not be given any medication for depression.

## 2012-10-01 NOTE — ED Notes (Signed)
Trying to call mother for transport home.

## 2012-10-01 NOTE — ED Notes (Signed)
Patient complained of tightness in chest and anxiety. Patient received medication.

## 2012-10-03 NOTE — Consult Note (Signed)
Agree with plan 

## 2012-10-04 NOTE — ED Provider Notes (Signed)
Medical screening examination/treatment/procedure(s) were conducted as a shared visit with non-physician practitioner(s) and myself.  I personally evaluated the patient during the encounter Pt with anxiety/depression, c/o periods increasing feelings anxiety and depression. Depressed mood. Labs. Psych team eval.   Suzi Roots, MD 10/04/12 1026

## 2013-07-12 ENCOUNTER — Ambulatory Visit: Payer: Self-pay | Admitting: Emergency Medicine
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
## 2013-12-06 ENCOUNTER — Emergency Department: Payer: Self-pay | Admitting: Emergency Medicine

## 2013-12-06 ENCOUNTER — Ambulatory Visit: Payer: Self-pay | Admitting: Physician Assistant

## 2013-12-06 LAB — CBC WITH DIFFERENTIAL/PLATELET
Basophil #: 0.1 10*3/uL (ref 0.0–0.1)
Basophil %: 1 %
EOS PCT: 3.1 %
Eosinophil #: 0.3 10*3/uL (ref 0.0–0.7)
HCT: 38 % (ref 35.0–47.0)
HGB: 12.3 g/dL (ref 12.0–16.0)
LYMPHS ABS: 3.5 10*3/uL (ref 1.0–3.6)
LYMPHS PCT: 42.3 %
MCH: 28.5 pg (ref 26.0–34.0)
MCHC: 32.3 g/dL (ref 32.0–36.0)
MCV: 88 fL (ref 80–100)
Monocyte #: 1 x10 3/mm — ABNORMAL HIGH (ref 0.2–0.9)
Monocyte %: 11.9 %
Neutrophil #: 3.4 10*3/uL (ref 1.4–6.5)
Neutrophil %: 41.7 %
PLATELETS: 303 10*3/uL (ref 150–440)
RBC: 4.3 10*6/uL (ref 3.80–5.20)
RDW: 17.5 % — ABNORMAL HIGH (ref 11.5–14.5)
WBC: 8.3 10*3/uL (ref 3.6–11.0)

## 2013-12-06 LAB — COMPREHENSIVE METABOLIC PANEL
ALBUMIN: 3.2 g/dL — AB (ref 3.4–5.0)
ALT: 24 U/L
ANION GAP: 13 (ref 7–16)
Alkaline Phosphatase: 68 U/L
BUN: 10 mg/dL (ref 7–18)
Bilirubin,Total: 0.2 mg/dL (ref 0.2–1.0)
CALCIUM: 8.8 mg/dL (ref 8.5–10.1)
CHLORIDE: 105 mmol/L (ref 98–107)
CREATININE: 0.82 mg/dL (ref 0.60–1.30)
Co2: 23 mmol/L (ref 21–32)
EGFR (Non-African Amer.): >60
Glucose: 73 mg/dL (ref 65–99)
Osmolality: 279 (ref 275–301)
Potassium: 3.6 mmol/L (ref 3.5–5.1)
SGOT(AST): 26 U/L (ref 15–37)
Sodium: 141 mmol/L (ref 136–145)
Total Protein: 7.3 g/dL (ref 6.4–8.2)

## 2013-12-06 LAB — LIPASE, BLOOD: Lipase: 991 U/L — ABNORMAL HIGH (ref 73–393)

## 2013-12-06 LAB — AMYLASE: Amylase: 95 U/L (ref 25–115)

## 2013-12-07 LAB — VALPROIC ACID LEVEL: Valproic Acid: 49 ug/mL — ABNORMAL LOW

## 2013-12-10 LAB — WOUND CULTURE

## 2013-12-28 ENCOUNTER — Ambulatory Visit: Payer: Self-pay | Admitting: Physician Assistant

## 2014-04-28 NOTE — Consult Note (Signed)
PATIENT NAME:  Alison Maxwell, Alison Maxwell MR#:  [id number] DATE OF BIRTH:  21-Mar-1975  DATE OF CONSULTATION:  12/21/2011  REFERRING PHYSICIAN:   CONSULTING PHYSICIAN:  Abrielle Finck K. 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 The patient is divorced for six years and has three kids that are 44 months old, 52-years-old, and 40 years old that live with her. The patient comes for inpatient at [facility] with chief complaint "seizures".   HISTORY OF PRESENT ILLNESS: The patient reports that she had problems with seizures for many years and her last seizure was four years ago and then had a seizure and was concerned and came here for help. She is being followed by Dr. Myrtis Ser at [facility] and last appointment was two months ago; next appointment is coming up on 02/13/2012.   PAST PSYCHIATRIC HISTORY: No previous history of inpatient hospitalizations on Psychiatry. No history of suicide attempts. No being followed by any psychiatrist at this time.   ALCOHOL AND DRUGS: Denied.  MENTAL STATUS EXAMINATION: The patient is seen laying comfortably in bed in hospital clothes, alert and oriented to place, person, and time. Fully aware of the situation that brought her for admission. She is aware that she was confused when she was admitted and she reports that she was stressed out and she could not think clearly but today she was smiling and she said she realizes she was confused but not anymore. She knew day, date, time, place, person, season, situation. Denies feeling depressed. Denies feeling hopeless or helpless. Denies suicidal or homicidal plans. Thought process is logical and goal directed. Cognition is intact. General knowledge and information is fair. Judgment is intact. She could spell the word world forward and backward without any problems. Insight and judgment fair.  IMPRESSION: Confusion probably secondary to postictal state, to be  ruled out.      RECOMMENDATIONS: The patient is to be followed and continued on current medications. If such an episode should occur again, re-evaluation can be done.  ____________________________ Wallace Cullens. Franchot Mimes, MD skc:drc D: 12/21/2011 08:48:17 ET T: 12/21/2011 09:00:36 ET JOB#: [id number]  cc: Arlyn Leak K. Franchot Mimes, MD, <Dictator> Dewain Penning MD ELECTRONICALLY SIGNED 12/23/2011 17:43
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
## 2014-04-28 NOTE — Consult Note (Signed)
PATIENT NAME:  Alison Maxwell, Alison Maxwell MR#:  [id number] DATE OF BIRTH:  Dec 01, 1975  DATE OF CONSULTATION:  11/17/2011  REFERRING PHYSICIAN:   CONSULTING PHYSICIAN:  Imunique Samad K. Derriana Oser, MD  This is a follow up on consultation done on this patient on 11/16/2011.   SUBJECTIVE: Patient was seen laying comfortably in bed. She smiled at the undersigned. Discussed with patient about the need for her to have therapy sessions to learn coping skills in dealing with death of her father to whom she was really close so that she can get over bereavement. She agreed to be followed on outpatient basis in the community and she requests that Social Services get her appointment at appropriate place for counseling.   RECOMMENDATION: Social Services to be contacted in consultation to find appropriate counseling service in the community where patient can get help and follow up for the same.    ____________________________ Wallace Cullens. Franchot Mimes, MD skc:cms D: 11/17/2011 09:22:08 ET T: 11/17/2011 11:13:31 ET JOB#: [id number]  cc: Arlyn Leak K. Franchot Mimes, MD, <Dictator>  Dewain Penning MD ELECTRONICALLY SIGNED 11/18/2011 19:00
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
## 2014-04-28 NOTE — H&P (Signed)
PATIENT NAME:  Alison Maxwell, BRICKNER MR#:  [id number] DATE OF BIRTH:  1976-01-03  DATE OF ADMISSION:  12/19/2011  PRIMARY CARE PHYSICIAN: Valera Castle, MD   CHIEF COMPLAINT: Altered mental status.   HISTORY OF PRESENT ILLNESS: The patient is a 39 year old female with a history of seizure disorder who was brought in via EMS after her mom found her slumped down on her chair at home confused. Her mom thought she could have possibly had a seizure. According to the family, she recently had some seizure medication change. She has no evidence of a seizure at this time. She remains confused, but it does not appear to be postictal confusion. She has no biting of the tongue, no urinary or bladder incontinence.   REVIEW OF SYSTEMS: The patient is very confused at this time, unable to obtain a good review of systems.   PAST MEDICAL HISTORY: Seizures.  MEDICATIONS: Depakote 500 mg daily.   ALLERGIES: Sulfa, erythromycin, as well as Keppra and Augmentin.   SOCIAL HISTORY: The patient had former substance abuse about four years ago for which  she was in rehab, but according to the mother she has been free of narcotics. No alcohol or IV drug use.   PAST SURGICAL HISTORY:  1. Knee surgery. 2. Cesarean section.   PHYSICAL EXAMINATION:  VITAL SIGNS: Temperature 98.3, pulse 109, respirations 22, blood pressure 114/68, 100% on room air.   GENERAL: The patient is alert, oriented, not in acute distress.  HEENT: Head is atraumatic. Anicteric sclerae. Mucous membranes are moist. Oropharynx is clear.   NECK: Supple without jugular venous distention, carotid bruit, or enlarged thyroid.    CARDIOVASCULAR: Tachycardia. No murmurs, gallops, or rubs. PMI is not displaced Dr. Luana Shu.   LUNGS: Clear to auscultation bilaterally without crackles, rales, rhonchi, or wheezing. Normal to percussion.   ABDOMEN: Bowel sounds present. Nontender, nondistended. No hepatosplenomegaly.    EXTREMITIES: No clubbing,  cyanosis, or edema.   NEUROLOGICAL:  Cranial nerves II through XII are intact.  She is oriented to name and place, not year, but she says it is 36; however, she does state December, but her speech is very tangential.   SKIN: Without any rashes or lesions.   LABORATORIES: Urine toxicology positive for TCAs and barbiturates pregnancy test is negative. Valproic acid is 15. White blood cells 12.8, hemoglobin 12.4, hematocrit 37.5, platelets are 298. Sodium 140, potassium 3.7, chloride 107, bicarbonate 25, BUN 10, creatinine 0.49. Glucose is 108, bilirubin is 0.2, calcium is 8.8, alkaline phosphatase 76, ALT 20, AST 22, total protein 7.0, albumin 3.9. CT of the head shows no acute intracranial hemorrhage or CVA. EKG shows normal sinus rhythm. No ST elevation or depression.   ASSESSMENT AND PLAN: A 39 year old female with a history of seizure disorder who presents with acute confusion.   1. Acute confusion: This does not appear to be like postictal confusion, could be polysubstance abuse versus stress reaction.  They tried Narcan in the Emergency Room which really did not help as far as her confusion. She has no signs of a seizure such as biting the tongue or bowel or bladder incontinence. She has had no seizure activity here. Her valproic acid level is low; however, I think again her confusion is not related to seizure.  I will go ahead and consult Psychiatry, order B12, TSH, and neuro checks every 4 hours  and monitor closely.  2. History of seizures: The patient's Depakote level is low. We will increase her Depakote to 500 b.i.d.  and monitor level tomorrow.  3. Slight leukocytosis: Without evidence of infection at this time. We will check a urinalysis and follow CBC.   CODE STATUS: The patient is FULL CODE STATUS.   TIME SPENT: Approximately 40 minutes.   ____________________________ Donell Beers. Benjie Karvonen, MD spm:cbb D: 12/19/2011 20:02:27 ET T: 12/20/2011 06:51:27 ET JOB#: [id number]  cc: Naylani Bradner P. Benjie Karvonen,  MD, <Dictator> Valera Castle, MD Donell Beers Frimy Uffelman MD ELECTRONICALLY SIGNED 12/21/2011 13:32

## 2014-04-28 NOTE — Discharge Summary (Signed)
PATIENT NAME:  Alison Maxwell, NAYLAH CORK MR#:  [id number] DATE OF BIRTH:  1975-07-18  DATE OF ADMISSION:  11/15/2011 DATE OF DISCHARGE:  11/17/2011  ADMITTING DIAGNOSIS: Recurrent seizure.  DISCHARGE DIAGNOSES:  1. Recurrent seizure with history of seizure disorder.  2. Dizziness likely due to Dilantin.  3. Itching to Keppra.  4. Tachycardia, resolved, possibly mild dehydration related, resolved on IV fluids.  5. 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 6. History of back pain.   DISCHARGE CONDITION: Stable.   DISCHARGE MEDICATIONS:  1. The patient is to start phenytoin 300 mg p.o. daily at bedtime. 2. Meclizine 25 mg p.o. 3 times daily as needed.  3. Alprazolam 0.25 mg 3 times daily as needed.   DO NOT TAKE: The patient is to stop tramadol.  HOME OXYGEN: None.   DIET: Regular, regular consistency.   ACTIVITY LIMITATIONS: As tolerated.   FOLLOW-UP:  1. Follow-up appointment with [facility] in one week after discharge.  2. Bereavement counseling in two days after discharge.   REASON FOR ADMISSION: The patient is a 39 year old Caucasian female with history of seizure disorder who was taken off of her seizure medications by [facility] who presented to the hospital with recurrent episodes of seizures. Please refer to Dr. Kelton Pillar admission note on 11/15/2011.   PHYSICAL EXAMINATION: On arrival to the hospital the patient's temperature was 98.1, heart rate was 110, respiration rate was 18, blood pressure 104/71, pulse oximetry was 98% on room air. Physical exam was unremarkable.  LAB DATA: The patient's lab data on 67/34/1937 basic metabolic panel within normal limits. Total protein 8.3, otherwise liver enzymes were unremarkable. Cardiac enzymes were normal. Dilantin level is 0.8. Urine drug screen was positive for benzodiazepines as well as tricyclic antidepressants. White blood cell count was slightly elevated to 11.5, hemoglobin 14.8, platelet count 375,  absolute neutrophil count was 8.6. Urinalysis revealed yellow cloudy urine, negative for glucose, bilirubin, or ketones, specific gravity 1.019, pH 5.0, 1+ blood, 100 mg/dL protein, positive for nitrites, negative for leukocyte esterase, 1 red blood cell, 3 white blood cells, trace bacteria as well as 1 epithelial cell. Mucous was also present and 3 hyaline cast. Urine culture, however, showed colonies too small to read.    The patient's EKG showed sinus tachy at 106 beats per minute, normal axis, no acute ST-T changes.   RADIOLOGIC STUDIES: CT scan of head without contrast 11/15/2011 was normal.   HOSPITAL COURSE:  1. The patient was admitted to the hospital. She apparently had been having seizures for the past one month and had a witnessed seizure episode in the Emergency Room. She apparently was given fosphenytoin in the Emergency Room, however, only a small dose was given and decision was made to load the patient with Keppra instead of fosphenytoin. The patient was given Keppra, however, with Keppra she developed itching and she could not tolerate it. Then the patient was loaded with Dilantin orally and her levels became therapeutic on 11/17/2011. In the morning the level was 12.0. It was felt that the patient is stable to be discharged home. She is to take 300 mg of Dilantin in the morning as well as one 300 mg dose tonight at bedtime on 11/17/2011. She is to follow-up with her neurologist at [facility] where we made appointment in the next one week after discharge. She is to have her Dilantin level checked as outpatient in approximately one week and advance her medications if needed.  2. In regards to tachycardia which was  felt to be likely dehydration, the patient was given IV fluids and her tachycardia resolved.  3. The patient was evaluated for complicated bereavement reaction by psychiatrist who felt that the patient would benefit from setting up psychologic bereavement counseling  as  outpatient. The patient will be evaluated by a social work specialist and she will be set up for this counseling.   DISPOSITION: She is being discharged in stable condition with the above-mentioned medications and follow-up.   VITAL SIGNS ON THE DAY OF DISCHARGE: Temperature 97.6, pulse 83, respiration rate 16, blood pressure 114/77.   Orthostatic vital signs were checked as the patient was complaining of some dizziness and those were within normal limits. It was felt that the patient's dizziness could have been related to Dilantin itself. The patient was advised to start taking Dilantin at bedtime. She is to follow-up with her primary care physician to establish primary care physician and neurologist in the next few days after discharge and make decisions about changing her medications to valproic acid which she took in the past if needed if Dilantin is also not tolerated well.  TIME SPENT: 40 minutes.  ____________________________ Theodoro Grist, MD rv:drc D: 11/17/2011 18:41:43 ET T: 11/19/2011 14:13:57 ET JOB#: [id number]  cc: Theodoro Grist, MD, <Dictator> [facility]  Glen Osborne MD ELECTRONICALLY SIGNED 11/24/2011 13:05

## 2014-04-28 NOTE — Discharge Summary (Signed)
PATIENT NAME:  Alison Maxwell, Alison Maxwell MR#:  [id number] DATE OF BIRTH:  06-05-1975  DATE OF ADMISSION:  12/19/2011 DATE OF DISCHARGE:  12/21/2011  HISTORY AND PHYSICAL: For a detailed note, please take a look at the history and physical done on admission by Dr. Benjie Karvonen.   DISCHARGE DIAGNOSES:   1. Altered mental status and confusion, likely secondary to postictal state, now resolved.  2. History of previous seizures.   DIET: The patient is being discharged on a regular diet.   ACTIVITY: As tolerated.   FOLLOWUP: Followup is with [facility] in the next 1 to 2 weeks.    DISCHARGE MEDICATION: Depakote 500 mg b.i.d.   [facility]OSPITAL CONSULTANT[facility] Dr. Franchot Mimes from Psychiatry.   [place], DIAGNOSTIC AND RADIOLOGICAL DATA:   CT scan of the head done without contrast on admission showing no acute intracranial abnormality.   BRIEF HOSPITAL COURSE: This is a 39 year old female who presented to the hospital due to altered mental status and confusion.   1. Altered mental status and confusion: The likely source of this is an acute stress reaction or possibly underlying postictal state after a seizure, although the patient did not have any typical symptoms of being in the postictal state. Overnight the patient's clinical symptoms have significantly improved, and her mental status was back to baseline. She had a CT head on admission which was essentially normal. Her urinalysis was also negative, and she had no other focal infectious or metabolic abnormalities. The patient was seen by Dr. Franchot Mimes, from Psychiatry, who did not think that the patient needed any acute inpatient psychiatric help; therefore, her one-to-one sitter was discontinued. Her urine toxicology was positive for barbiturates and tricyclics.  2. History of seizures: The patient apparently had had a seizure prior to coming to the hospital but had no further seizure-type activity while in the hospital. Her Depakote level was 15 on coming  in, and Depakote dose was doubled, and her Depakote level did increase. She currently is being discharged on the higher dose of Depakote with followup with [facility], who she sees as an outpatient.   CODE STATUS: The patient is a FULL CODE.   TIME SPENT WITH DISCHARGE: 35 minutes.   ____________________________ Belia Heman. Verdell Carmine, MD vjs:cbb D: 12/21/2011 15:23:17 ET T: 12/22/2011 11:02:18 ET JOB#: [id number]  cc: Belia Heman. Verdell Carmine, MD, <Dictator> [facility] Henreitta Leber MD ELECTRONICALLY SIGNED 01/04/2012 14:41

## 2014-05-06 ENCOUNTER — Ambulatory Visit
Admit: 2014-05-06 | Disposition: A | Discharge status: Home / Self Care | Payer: Self-pay | Attending: Family Medicine | Admitting: Family Medicine

## 2014-05-24 ENCOUNTER — Encounter: Payer: Self-pay | Admitting: Emergency Medicine

## 2014-05-24 ENCOUNTER — Ambulatory Visit
Admission: EM | Admit: 2014-05-24 | Discharge: 2014-05-24 | Disposition: A | Discharge status: Home / Self Care | Payer: No Typology Code available for payment source | Attending: Family Medicine | Admitting: Family Medicine

## 2014-05-24 ENCOUNTER — Ambulatory Visit: Payer: No Typology Code available for payment source

## 2014-05-24 DIAGNOSIS — T07XXXA Unspecified multiple injuries, initial encounter: Secondary | ICD-10-CM

## 2014-05-24 DIAGNOSIS — T148 Other injury of unspecified body region: Secondary | ICD-10-CM | POA: Diagnosis not present

## 2014-05-24 MED ORDER — HYDROCODONE-ACETAMINOPHEN 5-325 MG PO TABS: 1.0000 | ORAL_TABLET | Freq: Three times a day (TID) | ORAL | Status: DC | PRN

## 2014-05-24 MED ORDER — TIZANIDINE HCL 4 MG PO TABS: 4.0000 mg | ORAL_TABLET | Freq: Three times a day (TID) | ORAL | Status: DC | PRN

## 2014-05-24 MED ORDER — MELOXICAM 15 MG PO TABS: 15.0000 mg | ORAL_TABLET | Freq: Every day | ORAL | Status: DC

## 2014-05-24 MED ORDER — KETOROLAC TROMETHAMINE 60 MG/2ML IM SOLN
60.0000 mg | Freq: Once | INTRAMUSCULAR | Status: AC
  Administered 2014-05-24: 60 mg via INTRAMUSCULAR
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
## 2015-02-03 ENCOUNTER — Encounter: Payer: Self-pay | Admitting: *Deleted

## 2015-02-03 ENCOUNTER — Ambulatory Visit
Admission: EM | Admit: 2015-02-03 | Discharge: 2015-02-03 | Disposition: A | Discharge status: Home / Self Care | Payer: Medicaid Other | Attending: Family Medicine | Admitting: Family Medicine

## 2015-02-03 DIAGNOSIS — L03116 Cellulitis of left lower limb: Secondary | ICD-10-CM

## 2015-02-03 DIAGNOSIS — R21 Rash and other nonspecific skin eruption: Secondary | ICD-10-CM | POA: Diagnosis present

## 2015-02-03 HISTORY — DX: Methicillin resistant Staphylococcus aureus infection, unspecified site: A49.02

## 2015-02-03 MED ORDER — MUPIROCIN 2 % EX OINT: TOPICAL_OINTMENT | CUTANEOUS | Status: DC

## 2015-02-03 MED ORDER — SULFAMETHOXAZOLE-TRIMETHOPRIM 800-160 MG PO TABS: 2.0000 | ORAL_TABLET | Freq: Two times a day (BID) | ORAL | Status: AC

## 2015-02-03 MED ORDER — TRAMADOL HCL 50 MG PO TABS: 50.0000 mg | ORAL_TABLET | Freq: Three times a day (TID) | ORAL | Status: DC | PRN

## 2015-02-03 NOTE — ED Provider Notes (Signed)
[facility]  ____________________________________________  Time seen: Approximately 3:23 PM  I have reviewed the triage vital signs and the nursing notes.   HISTORY  Chief Complaint Rash  HPI Alison Maxwell is a 40 y.o. female presents for the complaints of left medial ankle rash. Patient states that this is been present 1 day. Patient reports that she normally has several areas of broken skin on arms and legs and states that she has a history of MRSA. 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 Patient reports that she is managed by her primary care physician as well as her dermatologist. Patient reports that she did have an area of scabbing to her left ankle a few days ago. Patient also reports that yesterday she felt like she had a spider crawling on her and states that she doesn't know if she was bitten by a spider or not. Denies other insect bites or insect exposures. Denies tick bite or tick exposures. Denies fevers.  Patient reports that she has redness and pain to left ankle. Patient states that current pain is 8 out of 10 to left ankle. Denies other pain or injury. Denies numbness or tingling sensation. Denies pain radiation. Reports her tetanus immunization up-to-date. Reports has continued to eat and drink well.  Denies pain radiation. Denies other pain. Reports continues ambulate well. Denies nausea, vomiting, diarrhea, abdominal pain, neck or back pain, injury or fall.  Last menstrual.: 2 weeks ago Denies chance of pregnancy. Patient states that she is not sexually active.  PCP: [facility]   Past Medical History  Diagnosis Date  . Postpartum depression   . MRSA (methicillin resistant Staphylococcus aureus)     There are no active problems to display for this patient.   Past Surgical History  Procedure Laterality Date  . Cesarean section    . Cesarean section    . Knee  surgery    . Liposuction    . Tubal ligation      Current Outpatient Rx  Name  Route  Sig  Dispense  Refill  . doxycycline (MONODOX) 100 MG capsule   Oral   Take 100 mg by mouth 2 (two) times daily.         . DULoxetine (CYMBALTA) 60 MG capsule   Oral   Take 60 mg by mouth daily.         .           . pregabalin (LYRICA) 75 MG capsule   Oral   Take 75 mg by mouth 2 (two) times daily.         Marland Kitchen ALPRAZolam (XANAX) 0.25 MG tablet   Oral   Take 0.25 mg by mouth 3 (three) times daily as needed for sleep.         . divalproex (DEPAKOTE ER) 500 MG 24 hr tablet   Oral   Take 500 mg by mouth 2 (two) times daily.         Marland Kitchen gabapentin (NEURONTIN) 300 MG capsule   Oral   Take 300 mg by mouth 3 (three) times daily.         .           .           .             Allergies Review of patient's allergies indicates no active allergies.  History reviewed. No  pertinent family history.  Social History Social History  Substance Use Topics  . Smoking status: Never Smoker   . Smokeless tobacco: Never Used  . Alcohol Use: No    Review of Systems Constitutional: No fever/chills Eyes: No visual changes. ENT: No sore throat. Cardiovascular: Denies chest pain. Respiratory: Denies shortness of breath. Gastrointestinal: No abdominal pain.  No nausea, no vomiting.  No diarrhea.  No constipation. Genitourinary: Negative for dysuria. Musculoskeletal: Negative for back pain. Skin: positive for rash. Neurological: Negative for headaches, focal weakness or numbness.  10-point ROS otherwise negative.  ____________________________________________   PHYSICAL EXAM:  VITAL SIGNS: ED Triage Vitals  Enc Vitals Group     BP 02/03/15 1451 113/90 mmHg     Pulse Rate 02/03/15 1451 104 Recheck 90     Resp -- 18     Temp 02/03/15 1451 98 F (36.7 C)     Temp Source 02/03/15 1451 Oral     SpO2 02/03/15 1451 100 %     Weight 02/03/15 1451 125 lb (56.7 kg)     Height 02/03/15  1451 5\' 1"  (1.549 m)     Head Cir --      Peak Flow --      Pain Score 02/03/15 1459 10     Pain Loc --      Pain Edu? --      Excl. in [place]? --    Constitutional: Alert and oriented. Well appearing and in no acute distress. Eyes: Conjunctivae are normal. PERRL. EOMI. Head: Atraumatic.   Nose: No congestion/rhinnorhea.  Mouth/Throat: Mucous membranes are moist.   Cardiovascular: Normal rate, regular rhythm. Grossly normal heart sounds.  Good peripheral circulation. Respiratory: Normal respiratory effort.  No retractions. Lungs CTAB. Gastrointestinal: Soft and nontender.  No CVA tenderness. Musculoskeletal: No lower or upper extremity tenderness nor edema. Bilateral pedal pulses equal and easily palpated.  Neurologic:  Normal speech and language. No gross focal neurologic deficits are appreciated. No gait instability. Skin:  Skin is warm, dry and intact. Except: left medial ankle 8 cm x 6cm area of mod erythema, no induration, non-circumferential minimal swelling, with excoriated crusting in center, no fluctuance, minimal seeping clear fluids at crusting site, mod TTP, full ROM, left lower extremity otherwise nontender. Bilateral upper and lower extremities with multiple excoriated lesions, without surrounding erythema or swelling or drainage. Bilateral pedal pulses equal and easily palpated. No calf tenderness bilaterally.  Psychiatric: Mood and affect are normal. Speech and behavior are normal. ____________________________________________   LABS (all labs ordered are listed, but only abnormal results are displayed)  Labs Reviewed  WOUND CULTURE      INITIAL IMPRESSION / [place] / ED COURSE  Pertinent labs & imaging results that were available during my care of the patient were reviewed by me and considered in my medical decision making (see chart for details).   Very well-appearing patient. No acute distress. 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 Patient reports on doxycycline from PCP. Denies other recent antibiotic use. Suspect cellulitis secondary to excoriated lesion. Area of erythema traced with skin pen. No I&D indicated. Afebrile. Will treat with oral bactrim, bactroban, and prn tramadol and prn otc ibuprofen or tylenol. Encourage elevation, keeping clean with soap and water, and close follow up. Recommended close follow up with PCP in 2 days for wound check. Patient also reports that she has crutches at home that she can use as needed.  Discussed follow up  with Primary care physician this week. Discussed follow up and return parameters including return to Urgent care or go to ER for worsening redness, swelling, pain, fevers, no resolution or any worsening concerns. Patient verbalized understanding and agreed to plan.   ____________________________________________   FINAL CLINICAL IMPRESSION(S) / ED DIAGNOSES  Final diagnoses:  Cellulitis of left lower leg    Note: This dictation was prepared with Dragon dictation along with smaller phrase technology. Any transcriptional errors that result from this process are unintentional.     Marylene Land, NP 02/03/15 1941  Marylene Land, NP 02/03/15 [id number]

## 2015-02-03 NOTE — ED Notes (Signed)
Patient started having symptom of red rash on her left ankle this AM. Patient has a history of MRSA.

## 2015-02-03 NOTE — Discharge Instructions (Signed)
Keep elevated. Take medication as prescribed. Keep clean.   Follow up with your primary care physician in 2 days for wound check.   Return to Urgent care or proceed to ER for fever, increased redness, increased pain, increased swelling, new or worsening concerns.   Cellulitis Cellulitis is an infection of the skin and the tissue beneath it. The infected area is usually red and tender. Cellulitis occurs most often in the arms and lower legs.  CAUSES  Cellulitis is caused by bacteria that enter the skin through cracks or cuts in the skin. The most common types of bacteria that cause cellulitis are staphylococci and streptococci. SIGNS AND SYMPTOMS   Redness and warmth.  Swelling.  Tenderness or pain.  Fever. DIAGNOSIS  Your health care provider can usually determine what is wrong based on a physical exam. Blood tests may also be done. TREATMENT  Treatment usually involves taking an antibiotic medicine. HOME CARE INSTRUCTIONS   Take your antibiotic medicine as directed by your health care provider. Finish the antibiotic even if you start to feel better.  Keep the infected arm or leg elevated to reduce swelling.  Apply a warm cloth to the affected area up to 4 times per day to relieve pain.  Take medicines only as directed by your health care provider.  Keep all follow-up visits as directed by your health care provider. SEEK MEDICAL CARE IF:   You notice red streaks coming from the infected area.  Your red area gets larger or turns dark in color.  Your bone or joint underneath the infected area becomes painful after the skin has healed.  Your infection returns in the same area or another area.  You notice a swollen bump in the infected area.  You develop new symptoms.  You have a fever. SEEK IMMEDIATE MEDICAL CARE IF:   You feel very sleepy.  You develop vomiting or diarrhea.  You have a general ill feeling (malaise) with muscle aches and pains.   This information  is not intended to replace advice given to you by your health care provider. Make sure you discuss any questions you have with your health care provider.   Document Released: 10/05/2004 Document Revised: 09/16/2014 Document Reviewed: 03/13/2011 Elsevier Interactive Patient Education [employer].

## 2015-02-08 LAB — WOUND CULTURE

## 2015-11-04 ENCOUNTER — Inpatient Hospital Stay: Payer: Medicaid Other | Attending: Internal Medicine | Admitting: Internal Medicine

## 2015-11-04 DIAGNOSIS — R531 Weakness: Secondary | ICD-10-CM | POA: Diagnosis not present

## 2015-11-04 DIAGNOSIS — F419 Anxiety disorder, unspecified: Secondary | ICD-10-CM | POA: Insufficient documentation

## 2015-11-04 DIAGNOSIS — R21 Rash and other nonspecific skin eruption: Secondary | ICD-10-CM | POA: Diagnosis not present

## 2015-11-04 DIAGNOSIS — D509 Iron deficiency anemia, unspecified: Secondary | ICD-10-CM | POA: Insufficient documentation

## 2015-11-04 DIAGNOSIS — G4700 Insomnia, unspecified: Secondary | ICD-10-CM | POA: Diagnosis not present

## 2015-11-04 DIAGNOSIS — Z8669 Personal history of other diseases of the nervous system and sense organs: Secondary | ICD-10-CM

## 2015-11-04 DIAGNOSIS — Z8614 Personal history of Methicillin resistant Staphylococcus aureus infection: Secondary | ICD-10-CM | POA: Diagnosis not present

## 2015-11-04 DIAGNOSIS — N920 Excessive and frequent menstruation with regular cycle: Secondary | ICD-10-CM | POA: Diagnosis not present

## 2015-11-04 DIAGNOSIS — D473 Essential (hemorrhagic) thrombocythemia: Secondary | ICD-10-CM | POA: Insufficient documentation

## 2015-11-04 DIAGNOSIS — D500 Iron deficiency anemia secondary to blood loss (chronic): Secondary | ICD-10-CM

## 2015-11-04 DIAGNOSIS — R5383 Other fatigue: Secondary | ICD-10-CM | POA: Diagnosis not present

## 2015-11-04 DIAGNOSIS — R51 Headache: Secondary | ICD-10-CM | POA: Diagnosis not present

## 2015-11-04 NOTE — Progress Notes (Signed)
Patient here for referral for anemia. Patient has multiple complaints of pain and discomfort. Has a complicated medical hestory.

## 2015-11-04 NOTE — Progress Notes (Signed)
CC: Alison Maxwell is a 40 y.o. female  Consult requested for anemia noted with hgb 9.8.  HPI: This is a pleasant 40 year old lady who has had her medical care predominantly at [place] is here to day to switch providers from [facility] to [facility] health. She seems very unhappy an dissatisfied with her carte at [facility] citing that she has not receivd answers for any of her problems She has multiple complaints today including itchy rash over her arms, feelign tired and feeling like her skin is burning all over, fullness in her head, insomnia.  Review of her records show that she was seen by multiple specialities including psychiatry, dermatology, rheumatology and hematology in the past , with no clear patholgy identified.   The rash had been biopsied in the past- non specific  I could not find any GI work up records She admits to having heavy periods. She has not been sleeping well, admits to being anxious, no feelings of suicide or self hurt   Denies melena, hematochezia, no weight loss  Past Medical History:  Diagnosis Date  . Anemia   . Arthritis    hands  . Headache   . MRSA (methicillin resistant Staphylococcus aureus)    pt unsure if currently is MRSA positive or not  . Postpartum depression   . Seizures ([place])    none in over 4 yrs   Past Surgical History:  Procedure Laterality Date  . CESAREAN SECTION    . CESAREAN SECTION    . KNEE SURGERY    . LIPOSUCTION    . TUBAL LIGATION     No family history on file. Social History   Social History  . Marital status: Single    Spouse name: N/A  . Number of children: N/A  . Years of education: N/A   Occupational History  . Not on file.   Social History Main Topics  . Smoking status: Never Smoker  . Smokeless tobacco: Never Used  . Alcohol use No  . Drug use: No  . Sexual activity: Not on file   Other Topics Concern  . Not on file   Social History Narrative  . No narrative on file   .      Result review:  A skin biiopsy  in her eocrd showed no neutophilc infiltatre, dermal scarring.  Iron panel consitently showed iron defcciciny since atleast 10/2014 with ferritn between 6-8 and iron sat at  6% A peripheral blood smear review by hematology at [facility] was reported showing microcytosis and anisocytosis consitent with iron deicncy Urine porphyrins were normal ANA was weekly positive.   ASSESSMENT/PLAN:   She was seen by hematology Dr.MetJien at [facility]  For with thrombocytosis and new onset microcytosis.it was felt at the time that this was reactive to iron deficiency/ She was recommended to start oral iron and see gyn for menorrhgia.  She deos not wish toretrun back to [facility] and wants to find new care team with [facility] including PCP, hemaotlogy GI and psychiatry  I have therefore made referrals to PCP and psychiatry because I feel that her anxiety and preoccupation with her health  Needs  to be addresseD as high priority.  With regard to long standing iron deficncey, she is unable to tolerate oral iron beyond 1 tablet a day and therefore will benfit form Iv iron replacement I have ordered a baseline iron  and b12 levels to be drawn prior to first Iron infusion  I have explained to her the  side effects, risks an dbenfits of Iv iron, and she agrees to proceed as planned  AS for the skin rash, she has been seen by dermatology and rheumatology, no clear etiology was found, it was felt the rash was due to compulsive scratching and is remarkable that it is only present on the surfaces that she reach to scratch.  I will also request Gi evalaution to identify the source of blood loss.  Start IV Iron  To begin next week   Return in 6 weeks to assess for repsone to iV iron

## 2015-11-11 ENCOUNTER — Inpatient Hospital Stay: Payer: Medicaid Other | Attending: Internal Medicine

## 2015-11-11 ENCOUNTER — Inpatient Hospital Stay: Payer: Medicaid Other

## 2015-11-11 ENCOUNTER — Other Ambulatory Visit: Payer: Self-pay | Admitting: *Deleted

## 2015-11-11 VITALS — BP 118/76 | HR 92 | Temp 98.1°F | Resp 20

## 2015-11-11 DIAGNOSIS — D509 Iron deficiency anemia, unspecified: Secondary | ICD-10-CM | POA: Insufficient documentation

## 2015-11-11 DIAGNOSIS — Z79899 Other long term (current) drug therapy: Secondary | ICD-10-CM | POA: Insufficient documentation

## 2015-11-11 DIAGNOSIS — D500 Iron deficiency anemia secondary to blood loss (chronic): Secondary | ICD-10-CM

## 2015-11-11 DIAGNOSIS — D508 Other iron deficiency anemias: Secondary | ICD-10-CM

## 2015-11-11 LAB — FERRITIN: FERRITIN: 5 ng/mL — AB (ref 11–307)

## 2015-11-11 LAB — VITAMIN B12: VITAMIN B 12: 265 pg/mL (ref 180–914)

## 2015-11-11 LAB — CBC WITH DIFFERENTIAL/PLATELET
BASOS ABS: 0.1 10*3/uL (ref 0–0.1)
BASOS PCT: 1 %
EOS ABS: 0.4 10*3/uL (ref 0–0.7)
Eosinophils Relative: 3 %
HCT: 35.2 % (ref 35.0–47.0)
HEMOGLOBIN: 10.9 g/dL — AB (ref 12.0–16.0)
LYMPHS ABS: 3.6 10*3/uL (ref 1.0–3.6)
Lymphocytes Relative: 30 %
MCH: 22.3 pg — AB (ref 26.0–34.0)
MCHC: 30.9 g/dL — ABNORMAL LOW (ref 32.0–36.0)
MCV: 72.1 fL — ABNORMAL LOW (ref 80.0–100.0)
Monocytes Absolute: 1.1 10*3/uL — ABNORMAL HIGH (ref 0.2–0.9)
Monocytes Relative: 9 %
NEUTROS PCT: 57 %
Neutro Abs: 6.9 10*3/uL — ABNORMAL HIGH (ref 1.4–6.5)
PLATELETS: 483 10*3/uL — AB (ref 150–440)
RBC: 4.87 MIL/uL (ref 3.80–5.20)
RDW: 20.2 % — ABNORMAL HIGH (ref 11.5–14.5)
WBC: 12.1 10*3/uL — AB (ref 3.6–11.0)

## 2015-11-11 LAB — IRON AND TIBC
IRON: 21 ug/dL — AB (ref 28–170)
SATURATION RATIOS: 4 % — AB (ref 10.4–31.8)
TIBC: 537 ug/dL — AB (ref 250–450)
UIBC: 516 ug/dL

## 2015-11-11 MED ORDER — SODIUM CHLORIDE 0.9 % IV SOLN
Freq: Once | INTRAVENOUS | Status: AC
  Administered 2015-11-11: 11:00:00 via INTRAVENOUS
  Filled 2015-11-11: qty 1000

## 2015-11-11 MED ORDER — IRON SUCROSE 20 MG/ML IV SOLN
200.0000 mg | Freq: Once | INTRAVENOUS | Status: AC
  Administered 2015-11-11: 200 mg via INTRAVENOUS

## 2015-11-11 MED ORDER — SODIUM CHLORIDE 0.9 % IV SOLN: 200.0000 mg | Freq: Once | INTRAVENOUS | Status: DC

## 2015-11-11 NOTE — Patient Instructions (Signed)
Iron Sucrose injection  What is this medicine?  IRON SUCROSE (AHY ern SOO krohs) is an iron complex. Iron is used to make healthy red blood cells, which carry oxygen and nutrients throughout the body. This medicine is used to treat iron deficiency anemia in people with chronic kidney disease.  This medicine may be used for other purposes; ask your health care provider or pharmacist if you have questions.  What should I tell my health care provider before I take this medicine?  They need to know if you have any of these conditions:  -anemia not caused by low iron levels  -heart disease  -high levels of iron in the blood  -kidney disease  -liver disease  -an unusual or allergic reaction to iron, other medicines, foods, dyes, or preservatives  -pregnant or trying to get pregnant  -breast-feeding  How should I use this medicine?  This medicine is for infusion into a vein. It is given by a health care professional in a hospital or clinic setting.  Talk to your pediatrician regarding the use of this medicine in children. While this drug may be prescribed for children as young as 2 years for selected conditions, precautions do apply.  Overdosage: If you think you have taken too much of this medicine contact a poison control center or emergency room at once.  NOTE: This medicine is only for you. Do not share this medicine with others.  What if I miss a dose?  It is important not to miss your dose. Call your doctor or health care professional if you are unable to keep an appointment.  What may interact with this medicine?  Do not take this medicine with any of the following medications:  -deferoxamine  -dimercaprol  -other iron products  This medicine may also interact with the following medications:  -chloramphenicol  -deferasirox  This list may not describe all possible interactions. Give your health care provider a list of all the medicines, herbs, non-prescription drugs, or dietary supplements you use. Also tell them if  you smoke, drink alcohol, or use illegal drugs. Some items may interact with your medicine.  What should I watch for while using this medicine?  Visit your doctor or healthcare professional regularly. Tell your doctor or healthcare professional if your symptoms do not start to get better or if they get worse. You may need blood work done while you are taking this medicine.  You may need to follow a special diet. Talk to your doctor. Foods that contain iron include: whole grains/cereals, dried fruits, beans, or peas, leafy green vegetables, and organ meats (liver, kidney).  What side effects may I notice from receiving this medicine?  Side effects that you should report to your doctor or health care professional as soon as possible:  -allergic reactions like skin rash, itching or hives, swelling of the face, lips, or tongue  -breathing problems  -changes in blood pressure  -cough  -fast, irregular heartbeat  -feeling faint or lightheaded, falls  -fever or chills  -flushing, sweating, or hot feelings  -joint or muscle aches/pains  -seizures  -swelling of the ankles or feet  -unusually weak or tired  Side effects that usually do not require medical attention (report to your doctor or health care professional if they continue or are bothersome):  -diarrhea  -feeling achy  -headache  -irritation at site where injected  -nausea, vomiting  -stomach upset  -tiredness  This list may not describe all possible side effects. Call your doctor   for medical advice about side effects. You may report side effects to FDA at 1-800-FDA-1088.  Where should I keep my medicine?  This drug is given in a hospital or clinic and will not be stored at home.  NOTE: This sheet is a summary. It may not cover all possible information. If you have questions about this medicine, talk to your doctor, pharmacist, or health care provider.      2016, Elsevier/Gold Standard. (2010-10-06 17:14:35)

## 2015-11-12 ENCOUNTER — Telehealth: Payer: Self-pay | Admitting: Gastroenterology

## 2015-11-12 ENCOUNTER — Other Ambulatory Visit: Payer: Self-pay

## 2015-11-12 NOTE — Telephone Encounter (Signed)
Patient needs a upper and lower GI and is returning a phone call.

## 2015-11-12 NOTE — Telephone Encounter (Signed)
Anemia D64.9 [facility] 11/17/2015 Dr. Vicente Males Medicaid Pre cert is not required

## 2015-11-12 NOTE — Telephone Encounter (Signed)
Gastroenterology Pre-Procedure Review  Request Date: 11/17/2015 Requesting Physician: Dr. Iona Beard  PATIENT REVIEW QUESTIONS: The patient responded to the following health history questions as indicated:    1. Are you having any GI issues? yes (Diarrhea, constipation) 2. Do you have a personal history of Polyps? no 3. Do you have a family history of Colon Cancer or Polyps? no 4. Diabetes Mellitus? no 5. Joint replacements in the past 12 months?no 6. Major health problems in the past 3 months?no 7. Any artificial heart valves, MVP, or defibrillator?no    MEDICATIONS & ALLERGIES:    Patient reports the following regarding taking any anticoagulation/antiplatelet therapy:   Plavix, Coumadin, Eliquis, Xarelto, Lovenox, Pradaxa, Brilinta, or Effient? no Aspirin? no  Patient confirms/reports the following medications:  Current Outpatient Prescriptions  Medication Sig Dispense Refill  . acetaminophen (TYLENOL) 325 MG tablet Take by mouth.    . clindamycin (CLEOCIN T) 1 % lotion Apply topically.    . DULoxetine (CYMBALTA) 60 MG capsule Take 60 mg by mouth daily.    . hydrOXYzine (ATARAX/VISTARIL) 25 MG tablet TAKE 3 TABLETS (75 MG TOTAL) BY MOUTH 3 (THREE) TIMES DAILY AS NEEDED FOR ITCHING.    Marland Kitchen ibuprofen (ADVIL,MOTRIN) 800 MG tablet Take by mouth.    . pregabalin (LYRICA) 75 MG capsule Take 75 mg by mouth 2 (two) times daily.     No current facility-administered medications for this visit.     Patient confirms/reports the following allergies:  Allergies  Allergen Reactions  . Erythromycin Base     Other reaction(s): UNKNOWN  . Levetiracetam Hives and Itching  . Sulfa Antibiotics Nausea And Vomiting and Rash    Other reaction(s): UNKNOWN    No orders of the defined types were placed in this encounter.   AUTHORIZATION INFORMATION Primary Insurance: 1D#: Group #:  Secondary Insurance: 1D#: Group #:  SCHEDULE INFORMATION: Date: 11/17/2015 Time: Location: [facility]

## 2015-11-15 ENCOUNTER — Encounter: Payer: Self-pay | Admitting: Internal Medicine

## 2015-11-15 ENCOUNTER — Encounter: Payer: Self-pay | Admitting: *Deleted

## 2015-11-16 ENCOUNTER — Other Ambulatory Visit: Payer: Self-pay

## 2015-11-16 DIAGNOSIS — D649 Anemia, unspecified: Secondary | ICD-10-CM

## 2015-11-16 MED ORDER — NA SULFATE-K SULFATE-MG SULF 17.5-3.13-1.6 GM/177ML PO SOLN: 1.0000 | Freq: Once | ORAL | 0 refills | Status: AC

## 2015-11-17 ENCOUNTER — Ambulatory Visit: Payer: Medicaid Other | Admitting: Anesthesiology

## 2015-11-17 ENCOUNTER — Ambulatory Visit
Admission: RE | Admit: 2015-11-17 | Discharge: 2015-11-17 | Disposition: A | Discharge status: Home / Self Care | Payer: Medicaid Other | Source: Ambulatory Visit | Attending: Gastroenterology | Admitting: Gastroenterology

## 2015-11-17 ENCOUNTER — Encounter: Payer: Self-pay | Admitting: Anesthesiology

## 2015-11-17 ENCOUNTER — Encounter: Payer: Self-pay | Admitting: Internal Medicine

## 2015-11-17 ENCOUNTER — Encounter
Admission: RE | Disposition: A | Discharge status: Home / Self Care | Payer: Self-pay | Source: Ambulatory Visit | Attending: Gastroenterology

## 2015-11-17 DIAGNOSIS — D649 Anemia, unspecified: Secondary | ICD-10-CM

## 2015-11-17 DIAGNOSIS — Z79899 Other long term (current) drug therapy: Secondary | ICD-10-CM | POA: Diagnosis not present

## 2015-11-17 DIAGNOSIS — D509 Iron deficiency anemia, unspecified: Secondary | ICD-10-CM | POA: Diagnosis not present

## 2015-11-17 HISTORY — PX: COLONOSCOPY WITH PROPOFOL: SHX5780

## 2015-11-17 HISTORY — DX: Unspecified osteoarthritis, unspecified site: M19.90

## 2015-11-17 HISTORY — DX: Headache: R51

## 2015-11-17 HISTORY — DX: Anemia, unspecified: D64.9

## 2015-11-17 HISTORY — DX: Headache, unspecified: R51.9

## 2015-11-17 HISTORY — PX: ESOPHAGOGASTRODUODENOSCOPY (EGD) WITH PROPOFOL: SHX5813

## 2015-11-17 HISTORY — DX: Unspecified convulsions: R56.9

## 2015-11-17 SURGERY — COLONOSCOPY WITH PROPOFOL
Anesthesia: Monitor Anesthesia Care | Site: Throat | Wound class: Contaminated

## 2015-11-17 MED ORDER — GLYCOPYRROLATE 0.2 MG/ML IJ SOLN
INTRAMUSCULAR | Status: DC | PRN
  Administered 2015-11-17: 0.2 mg via INTRAVENOUS

## 2015-11-17 MED ORDER — OXYCODONE HCL 5 MG/5ML PO SOLN: 5.0000 mg | Freq: Once | ORAL | Status: DC | PRN

## 2015-11-17 MED ORDER — LACTATED RINGERS IV SOLN: INTRAVENOUS | Status: DC

## 2015-11-17 MED ORDER — LACTATED RINGERS IV SOLN
INTRAVENOUS | Status: DC | PRN
  Administered 2015-11-17: 09:00:00 via INTRAVENOUS

## 2015-11-17 MED ORDER — STERILE WATER FOR IRRIGATION IR SOLN
Status: DC | PRN
  Administered 2015-11-17: 09:00:00

## 2015-11-17 MED ORDER — PROPOFOL 10 MG/ML IV BOLUS
INTRAVENOUS | Status: DC | PRN
  Administered 2015-11-17 (×2): 30 mg via INTRAVENOUS
  Administered 2015-11-17: 20 mg via INTRAVENOUS
  Administered 2015-11-17: 70 mg via INTRAVENOUS
  Administered 2015-11-17 (×3): 20 mg via INTRAVENOUS

## 2015-11-17 MED ORDER — OXYCODONE HCL 5 MG PO TABS: 5.0000 mg | ORAL_TABLET | Freq: Once | ORAL | Status: DC | PRN

## 2015-11-17 MED ORDER — LIDOCAINE HCL (CARDIAC) 20 MG/ML IV SOLN
INTRAVENOUS | Status: DC | PRN
  Administered 2015-11-17: 40 mg via INTRAVENOUS

## 2015-11-17 SURGICAL SUPPLY — 35 items
BALLN DILATOR 10-12 8 (BALLOONS)
BALLN DILATOR 12-15 8 (BALLOONS)
BALLN DILATOR 15-18 8 (BALLOONS)
BALLN DILATOR CRE 0-12 8 (BALLOONS)
BALLN DILATOR ESOPH 8 10 CRE (MISCELLANEOUS) IMPLANT
BALLOON DILATOR 12-15 8 (BALLOONS) IMPLANT
BALLOON DILATOR 15-18 8 (BALLOONS) IMPLANT
BALLOON DILATOR CRE 0-12 8 (BALLOONS) IMPLANT
BLOCK BITE 60FR ADLT L/F GRN (MISCELLANEOUS) ×4 IMPLANT
CANISTER SUCT 1200ML W/VALVE (MISCELLANEOUS) ×4 IMPLANT
CLIP HMST 235XBRD CATH ROT (MISCELLANEOUS) IMPLANT
CLIP RESOLUTION 360 11X235 (MISCELLANEOUS)
FCP ESCP3.2XJMB 240X2.8X (MISCELLANEOUS)
FORCEPS BIOP RAD 4 LRG CAP 4 (CUTTING FORCEPS) ×2 IMPLANT
FORCEPS BIOP RJ4 240 W/NDL (MISCELLANEOUS)
FORCEPS ESCP3.2XJMB 240X2.8X (MISCELLANEOUS) IMPLANT
GOWN CVR UNV OPN BCK APRN NK (MISCELLANEOUS) ×4 IMPLANT
GOWN ISOL THUMB LOOP REG UNIV (MISCELLANEOUS) ×8
INJECTOR VARIJECT VIN23 (MISCELLANEOUS) IMPLANT
KIT DEFENDO VALVE AND CONN (KITS) IMPLANT
KIT ENDO PROCEDURE OLY (KITS) ×4 IMPLANT
MARKER SPOT ENDO TATTOO 5ML (MISCELLANEOUS) IMPLANT
PAD GROUND ADULT SPLIT (MISCELLANEOUS) IMPLANT
PROBE APC STR FIRE (PROBE) IMPLANT
RETRIEVER NET PLAT FOOD (MISCELLANEOUS) IMPLANT
RETRIEVER NET ROTH 2.5X230 LF (MISCELLANEOUS) IMPLANT
SNARE SHORT THROW 13M SML OVAL (MISCELLANEOUS) IMPLANT
SNARE SHORT THROW 30M LRG OVAL (MISCELLANEOUS) IMPLANT
SNARE SNG USE RND 15MM (INSTRUMENTS) IMPLANT
SPOT EX ENDOSCOPIC TATTOO (MISCELLANEOUS)
SYR INFLATION 60ML (SYRINGE) IMPLANT
TRAP ETRAP POLY (MISCELLANEOUS) IMPLANT
VARIJECT INJECTOR VIN23 (MISCELLANEOUS)
WATER STERILE IRR 250ML POUR (IV SOLUTION) ×4 IMPLANT
WIRE CRE 18-20MM 8CM F G (MISCELLANEOUS) IMPLANT

## 2015-11-17 NOTE — H&P (Signed)
Alison Bellows MD [address], [place], [place] [address] Phone: [phone] Fax : [phone]  Primary Care Physician:  Sharyne Peach, MD Primary Gastroenterologist:  Dr. Jonathon Maxwell   Pre-Procedure History & Physical: HPI:  Alison Maxwell is a 40 y.o. female is here for an endoscopy and colonoscopy.   Past Medical History:  Diagnosis Date  . Anemia   . Arthritis    hands  . Headache   . MRSA (methicillin resistant Staphylococcus aureus)    pt unsure if currently is MRSA positive or not  . Postpartum depression   . Seizures ([place])    none in over 4 yrs    Past Surgical History:  Procedure Laterality Date  . CESAREAN SECTION    . CESAREAN SECTION    . KNEE SURGERY    . LIPOSUCTION    . TUBAL LIGATION      Prior to Admission medications   Medication Sig Start Date End Date Taking? Authorizing Provider  acetaminophen (TYLENOL) 325 MG tablet Take by mouth.   Yes Historical Provider, MD  clindamycin (CLEOCIN T) 1 % lotion Apply topically.   Yes Historical Provider, MD  DULoxetine (CYMBALTA) 60 MG capsule Take 60 mg by mouth daily.   Yes Historical Provider, MD  ibuprofen (ADVIL,MOTRIN) 800 MG tablet Take by mouth.   Yes Historical Provider, MD  pregabalin (LYRICA) 75 MG capsule Take 75 mg by mouth 2 (two) times daily.   Yes Historical Provider, MD  hydrOXYzine (ATARAX/VISTARIL) 25 MG tablet TAKE 3 TABLETS (75 MG TOTAL) BY MOUTH 3 (THREE) TIMES DAILY AS NEEDED FOR ITCHING. 08/18/15   Historical Provider, MD    Allergies as of 11/12/2015 - Review Complete 11/12/2015  Allergen Reaction Noted  . Erythromycin base  08/09/2012  . Levetiracetam Hives and Itching 11/23/2011  . Sulfa antibiotics Nausea And Vomiting and Rash 08/09/2012    History reviewed. No pertinent family history.  Social History   Social History  . Marital status: Single    Spouse name: N/A  . Number of children: N/A  . Years of education: N/A   Occupational History  . Not on file.    Social History Main Topics  . Smoking status: Never Smoker  . Smokeless tobacco: Never Used  . Alcohol use No  . Drug use: No  . Sexual activity: Not on file   Other Topics Concern  . Not on file   Social History Narrative  . No narrative on file    Review of Systems: See HPI, otherwise negative ROS  Physical Exam: BP 106/83   Pulse 89   Temp 98.7 F (37.1 C) (Temporal)   Resp 17   Ht 5\' 1"  (1.549 m)   Wt 184 lb (83.5 kg)   LMP 05/10/2015 (Approximate) Comment: pt unable to urinate, Dr. Jaci Standard aware  SpO2 100%   BMI 34.77 kg/m  General:   Alert,  pleasant and cooperative in NAD Head:  Normocephalic and atraumatic. Neck:  Supple; no masses or thyromegaly. Lungs:  Clear throughout to auscultation.    Heart:  Regular rate and rhythm. Abdomen:  Soft, nontender and nondistended. Normal bowel sounds, without guarding, and without rebound.   Neurologic:  Alert and  oriented x4;  grossly normal neurologically.  Impression/Plan: Alison Maxwell is here for an endoscopy and colonoscopy to be performed for iron deficiency anemia  Risks, benefits, limitations, and alternatives regarding  endoscopy and colonoscopy have been reviewed with the patient.  Questions have been answered.  All  parties agreeable.   Alison Bellows, MD  11/17/2015, 8:05 AM

## 2015-11-17 NOTE — Anesthesia Procedure Notes (Signed)
Procedure Name: MAC Performed by: Manoj Enriquez Pre-anesthesia Checklist: Patient identified, Emergency Drugs available, Suction available, Timeout performed and Patient being monitored Patient Re-evaluated:Patient Re-evaluated prior to inductionOxygen Delivery Method: Nasal cannula Placement Confirmation: positive ETCO2       

## 2015-11-17 NOTE — Op Note (Signed)
[facility] Gastroenterology Patient Name: Alison Maxwell Procedure Date: 11/17/2015 8:34 AM MRN: [id number] Account #: [id number] Date of Birth: August 03, 1975 Admit Type: Outpatient Age: 40 Room: [facility] OR ROOM 01 Gender: Female Note Status: Finalized Procedure:            Colonoscopy Indications:          Iron deficiency anemia Providers:            Jonathon Bellows MD, MD Medicines:            Monitored Anesthesia Care Complications:        No immediate complications. Procedure:            Pre-Anesthesia Assessment:                       - Prior to the procedure, a History and Physical was                        performed, and patient medications, allergies and                        sensitivities were reviewed. The patient's tolerance of                        previous anesthesia was reviewed.                       After obtaining informed consent, the colonoscope was                        passed under direct vision. Throughout the procedure,                        the patient's blood pressure, pulse, and oxygen                        saturations were monitored continuously. The was                        introduced through the anus and advanced to the the                        rectum. The colonoscopy was performed with ease. The                        patient tolerated the procedure well. The quality of                        the bowel preparation was poor. Findings:      The digital rectal exam findings include {skip}. [pertinent negatives].       Solid stool in rectum      Solid stool in rectum could not proceed further Impression:           - Preparation of the colon was poor.                       - Abnormal digital rectal exam.                       - No specimens collected. Recommendation:       - Repeat colonoscopy in 1 week  because the bowel                        preparation was poor.                       - Return to GI office in 1 week. Procedure Code(s):     --- Professional ---                       [phone], 65, Colonoscopy, flexible; diagnostic, including                        collection of specimen(s) by brushing or washing, when                        performed (separate procedure) Diagnosis Code(s):    --- Professional ---                       D50.9, Iron deficiency anemia, unspecified                       K62.89, Other specified diseases of anus and rectum CPT copyright 2016 American [employer] Association. All rights reserved. The codes documented in this report are preliminary and upon coder review may  be revised to meet current compliance requirements. Jonathon Bellows, MD Jonathon Bellows MD, MD 11/17/2015 9:04:05 AM This report has been signed electronically. Number of Addenda: 0 Note Initiated On: 11/17/2015 8:34 AM      [facility]

## 2015-11-17 NOTE — Op Note (Signed)
[facility] Gastroenterology Patient Name: Alison Maxwell Procedure Date: 11/17/2015 8:34 AM MRN: [id number] Account #: [id number] Date of Birth: 1975-08-16 Admit Type: Outpatient Age: 40 Room: [facility] OR ROOM 01 Gender: Female Note Status: Finalized Procedure:            Upper GI endoscopy Indications:          Iron deficiency anemia Providers:            Jonathon Bellows MD, MD Medicines:            Monitored Anesthesia Care Complications:        No immediate complications. Procedure:            Pre-Anesthesia Assessment:                       - ASA Grade Assessment: II - A patient with mild                        systemic disease.                       - Prior to the procedure, a History and Physical was                        performed, and patient medications, allergies and                        sensitivities were reviewed. The patient's tolerance of                        previous anesthesia was reviewed.                       - Prior to the procedure, a History and Physical was                        performed, and patient medications, allergies and                        sensitivities were reviewed. The patient's tolerance of                        previous anesthesia was reviewed.                       After obtaining informed consent, the endoscope was                        passed under direct vision. Throughout the procedure,                        the patient's blood pressure, pulse, and oxygen                        saturations were monitored continuously. The Olympus                        [id number]-HQ190 Endoscope (S#. [id number]) was introduced                        through the mouth, and advanced to the third part of  duodenum. The upper GI endoscopy was accomplished with                        ease. The patient tolerated the procedure well. Findings:      The in the duodenum was normal. Biopsies for histology were taken with a       cold  forceps for evaluation of celiac disease.      The esophagus was normal.      A medium amount of food (residue) was found in the gastric body. was       able to suction the food , likely jelloo or apple sauce Impression:           - Normal. Biopsied.                       - Normal esophagus.                       - A medium amount of food (residue) in the stomach. Recommendation:       - Await pathology results. Procedure Code(s):    --- Professional ---                       [phone], Esophagogastroduodenoscopy, flexible, transoral;                        with biopsy, single or multiple Diagnosis Code(s):    --- Professional ---                       D50.9, Iron deficiency anemia, unspecified CPT copyright 2016 American Medical Association. All rights reserved. The codes documented in this report are preliminary and upon coder review may  be revised to meet current compliance requirements. Jonathon Bellows, MD Jonathon Bellows MD, MD 11/17/2015 8:57:18 AM This report has been signed electronically. Number of Addenda: 0 Note Initiated On: 11/17/2015 8:34 AM Total Procedure Duration: 0 hours 4 minutes 36 seconds       [facility]

## 2015-11-17 NOTE — Telephone Encounter (Signed)
She can keep the appointment as is, since she is taking IV iron, I d like to allow some time for that to work

## 2015-11-17 NOTE — Anesthesia Postprocedure Evaluation (Signed)
Anesthesia Post Note  Patient: Alison Maxwell  Procedure(s) Performed: Procedure(s) (LRB): COLONOSCOPY WITH PROPOFOL (N/A) ESOPHAGOGASTRODUODENOSCOPY (EGD) WITH PROPOFOL (N/A)  Patient location during evaluation: PACU Anesthesia Type: MAC Level of consciousness: awake and alert Pain management: pain level controlled Vital Signs Assessment: post-procedure vital signs reviewed and stable Respiratory status: spontaneous breathing Cardiovascular status: blood pressure returned to baseline Postop Assessment: no headache Anesthetic complications: no    Jaci Standard, III,  Nicky Kras D

## 2015-11-17 NOTE — Transfer of Care (Signed)
Immediate Anesthesia Transfer of Care Note  Patient: Alison Maxwell  Procedure(s) Performed: Procedure(s) with comments: COLONOSCOPY WITH PROPOFOL (N/A) - INCOMPLETE PROCEDURE NO PREP ESOPHAGOGASTRODUODENOSCOPY (EGD) WITH PROPOFOL (N/A)  Patient Location: PACU  Anesthesia Type: MAC  Level of Consciousness: awake, alert  and patient cooperative  Airway and Oxygen Therapy: Patient Spontanous Breathing and Patient connected to supplemental oxygen  Post-op Assessment: Post-op Vital signs reviewed, Patient's Cardiovascular Status Stable, Respiratory Function Stable, Patent Airway and No signs of Nausea or vomiting  Post-op Vital Signs: Reviewed and stable  Complications: No apparent anesthesia complications

## 2015-11-17 NOTE — Anesthesia Preprocedure Evaluation (Signed)
Anesthesia Evaluation  Patient identified by MRN, date of birth, ID band Patient awake  General Assessment Comment:Patient unable to provide urine for urine pregnancy test.  We discussed this and she has had a tubal ligation and is not sexually active.  She understood the risks and signed a letter stating as such.  Decision to proceed with the case.  Reviewed: Allergy & Precautions, H&P , NPO status   History of Anesthesia Complications Negative for: history of anesthetic complications  Airway Mallampati: II  TM Distance: >3 FB Neck ROM: full    Dental no notable dental hx.    Pulmonary neg pulmonary ROS,    Pulmonary exam normal        Cardiovascular negative cardio ROS Normal cardiovascular exam     Neuro/Psych    GI/Hepatic negative GI ROS, Neg liver ROS,   Endo/Other  negative endocrine ROS  Renal/GU negative Renal ROS     Musculoskeletal   Abdominal   Peds  Hematology  (+) anemia ,   Anesthesia Other Findings   Reproductive/Obstetrics                            Anesthesia Physical Anesthesia Plan  ASA: II  Anesthesia Plan: MAC   Post-op Pain Management:    Induction:   Airway Management Planned:   Additional Equipment:   Intra-op Plan:   Post-operative Plan:   Informed Consent:   Plan Discussed with:   Anesthesia Plan Comments:         Anesthesia Quick Evaluation

## 2015-11-18 ENCOUNTER — Encounter: Payer: Self-pay | Admitting: *Deleted

## 2015-11-18 ENCOUNTER — Inpatient Hospital Stay: Payer: Medicaid Other

## 2015-11-18 ENCOUNTER — Telehealth: Payer: Self-pay | Admitting: Internal Medicine

## 2015-11-18 NOTE — Telephone Encounter (Signed)
Patient had endoscopy/colonoscopy yesterday. They were unable to complete it because she has a blockage. She said today she is experiencing incontinence and can not leave the house to come for her iron infusion. She would like nurse to call and discuss whether the iron could be contributing to the blockage. She also is concerned that she will have the same issue when they try to do it again next week and she may have to cancel that iron infusion as well. Current question, assuming she won't have the problem next week, is whether we should schedule her for two venofers next week, on Tuesday and Thursday, or just extend her weekly venofers out to make up for missing today and add on one for 11/29. Please advise, thanks.

## 2015-11-18 NOTE — Progress Notes (Signed)
Made a referal to [facility] this afternoon. Faxed MD note and request for new patient appt to their office.

## 2015-11-25 ENCOUNTER — Inpatient Hospital Stay: Payer: Medicaid Other

## 2015-11-25 VITALS — BP 121/80 | HR 99 | Temp 97.7°F | Resp 18

## 2015-11-25 DIAGNOSIS — D500 Iron deficiency anemia secondary to blood loss (chronic): Secondary | ICD-10-CM

## 2015-11-25 DIAGNOSIS — D509 Iron deficiency anemia, unspecified: Secondary | ICD-10-CM | POA: Diagnosis not present

## 2015-11-25 MED ORDER — IRON SUCROSE 20 MG/ML IV SOLN
200.0000 mg | Freq: Once | INTRAVENOUS | Status: AC
  Administered 2015-11-25: 200 mg via INTRAVENOUS
  Filled 2015-11-25: qty 10

## 2015-11-25 MED ORDER — SODIUM CHLORIDE 0.9 % IV SOLN
Freq: Once | INTRAVENOUS | Status: AC
  Administered 2015-11-25: 10:00:00 via INTRAVENOUS
  Filled 2015-11-25: qty 1000

## 2015-11-30 ENCOUNTER — Ambulatory Visit: Payer: Self-pay

## 2015-11-30 ENCOUNTER — Encounter: Payer: Self-pay | Admitting: Gastroenterology

## 2015-12-01 ENCOUNTER — Inpatient Hospital Stay: Payer: Medicaid Other

## 2015-12-13 ENCOUNTER — Encounter: Payer: Self-pay | Admitting: Internal Medicine

## 2015-12-14 ENCOUNTER — Other Ambulatory Visit: Payer: Self-pay | Admitting: *Deleted

## 2015-12-14 DIAGNOSIS — D508 Other iron deficiency anemias: Secondary | ICD-10-CM

## 2015-12-15 ENCOUNTER — Other Ambulatory Visit: Payer: Self-pay | Admitting: *Deleted

## 2015-12-15 ENCOUNTER — Encounter: Payer: Self-pay | Admitting: Hematology and Oncology

## 2015-12-15 ENCOUNTER — Inpatient Hospital Stay (HOSPITAL_BASED_OUTPATIENT_CLINIC_OR_DEPARTMENT_OTHER): Payer: Medicaid Other | Admitting: Hematology and Oncology

## 2015-12-15 ENCOUNTER — Ambulatory Visit
Admission: RE | Admit: 2015-12-15 | Discharge: 2015-12-15 | Disposition: A | Discharge status: Home / Self Care | Payer: Medicaid Other | Source: Ambulatory Visit | Attending: Hematology and Oncology | Admitting: Hematology and Oncology

## 2015-12-15 ENCOUNTER — Inpatient Hospital Stay: Payer: Medicaid Other | Attending: Internal Medicine

## 2015-12-15 VITALS — BP 117/84 | HR 96 | Temp 98.6°F | Resp 20 | Wt 192.9 lb

## 2015-12-15 DIAGNOSIS — Z79899 Other long term (current) drug therapy: Secondary | ICD-10-CM | POA: Diagnosis not present

## 2015-12-15 DIAGNOSIS — R0602 Shortness of breath: Secondary | ICD-10-CM | POA: Insufficient documentation

## 2015-12-15 DIAGNOSIS — R61 Generalized hyperhidrosis: Secondary | ICD-10-CM

## 2015-12-15 DIAGNOSIS — R140 Abdominal distension (gaseous): Secondary | ICD-10-CM

## 2015-12-15 DIAGNOSIS — F329 Major depressive disorder, single episode, unspecified: Secondary | ICD-10-CM

## 2015-12-15 DIAGNOSIS — R51 Headache: Secondary | ICD-10-CM | POA: Diagnosis not present

## 2015-12-15 DIAGNOSIS — R198 Other specified symptoms and signs involving the digestive system and abdomen: Secondary | ICD-10-CM | POA: Diagnosis not present

## 2015-12-15 DIAGNOSIS — M3500 Sicca syndrome, unspecified: Secondary | ICD-10-CM | POA: Insufficient documentation

## 2015-12-15 DIAGNOSIS — D509 Iron deficiency anemia, unspecified: Secondary | ICD-10-CM

## 2015-12-15 DIAGNOSIS — N920 Excessive and frequent menstruation with regular cycle: Secondary | ICD-10-CM | POA: Insufficient documentation

## 2015-12-15 DIAGNOSIS — L281 Prurigo nodularis: Secondary | ICD-10-CM | POA: Diagnosis not present

## 2015-12-15 DIAGNOSIS — D649 Anemia, unspecified: Secondary | ICD-10-CM

## 2015-12-15 DIAGNOSIS — R05 Cough: Secondary | ICD-10-CM | POA: Insufficient documentation

## 2015-12-15 DIAGNOSIS — R079 Chest pain, unspecified: Secondary | ICD-10-CM | POA: Diagnosis not present

## 2015-12-15 DIAGNOSIS — M069 Rheumatoid arthritis, unspecified: Secondary | ICD-10-CM | POA: Diagnosis not present

## 2015-12-15 DIAGNOSIS — M797 Fibromyalgia: Secondary | ICD-10-CM | POA: Insufficient documentation

## 2015-12-15 DIAGNOSIS — R52 Pain, unspecified: Secondary | ICD-10-CM | POA: Insufficient documentation

## 2015-12-15 DIAGNOSIS — Z8669 Personal history of other diseases of the nervous system and sense organs: Secondary | ICD-10-CM

## 2015-12-15 DIAGNOSIS — R918 Other nonspecific abnormal finding of lung field: Secondary | ICD-10-CM | POA: Insufficient documentation

## 2015-12-15 DIAGNOSIS — D473 Essential (hemorrhagic) thrombocythemia: Secondary | ICD-10-CM

## 2015-12-15 DIAGNOSIS — D500 Iron deficiency anemia secondary to blood loss (chronic): Secondary | ICD-10-CM

## 2015-12-15 DIAGNOSIS — F439 Reaction to severe stress, unspecified: Secondary | ICD-10-CM

## 2015-12-15 DIAGNOSIS — R768 Other specified abnormal immunological findings in serum: Secondary | ICD-10-CM

## 2015-12-15 DIAGNOSIS — D508 Other iron deficiency anemias: Secondary | ICD-10-CM

## 2015-12-15 LAB — CBC WITH DIFFERENTIAL/PLATELET
Basophils Absolute: 0.1 10*3/uL (ref 0–0.1)
Basophils Relative: 1 %
Eosinophils Absolute: 0.4 10*3/uL (ref 0–0.7)
Eosinophils Relative: 4 %
HCT: 37.5 % (ref 35.0–47.0)
Hemoglobin: 12 g/dL (ref 12.0–16.0)
Lymphocytes Relative: 35 %
Lymphs Abs: 3.3 10*3/uL (ref 1.0–3.6)
MCH: 24.8 pg — ABNORMAL LOW (ref 26.0–34.0)
MCHC: 32 g/dL (ref 32.0–36.0)
MCV: 77.5 fL — ABNORMAL LOW (ref 80.0–100.0)
Monocytes Absolute: 0.9 10*3/uL (ref 0.2–0.9)
Monocytes Relative: 10 %
Neutro Abs: 4.6 10*3/uL (ref 1.4–6.5)
Neutrophils Relative %: 50 %
Platelets: 367 10*3/uL (ref 150–440)
RBC: 4.84 MIL/uL (ref 3.80–5.20)
RDW: 24.7 % — ABNORMAL HIGH (ref 11.5–14.5)
WBC: 9.3 10*3/uL (ref 3.6–11.0)

## 2015-12-15 LAB — IRON AND TIBC
Iron: 47 ug/dL (ref 28–170)
Saturation Ratios: 11 % (ref 10.4–31.8)
TIBC: 431 ug/dL (ref 250–450)
UIBC: 385 ug/dL

## 2015-12-15 LAB — FERRITIN: Ferritin: 33 ng/mL (ref 11–307)

## 2015-12-15 LAB — FOLATE: Folate: 10 ng/mL (ref >5.9)

## 2015-12-15 NOTE — Progress Notes (Signed)
Patient presents today with multiple problems.  States she feels short of breath (sometimes has to gasp for breath), having chest pain in mid chest for past 2 weeks.  States it goes into her left arm at times.  Today she is having tingling in lower left arm.  She states she has no appetite.  Scheduled for colonoscopy/endoscopy however colonoscopy could not be done due to impaction.  States she is tired all the time. Does not sleep for 2-3 days at a time then crashes and sleeps 18-20 hrs. C/o muscle weakness in legs.  Falls a lot - coordination not so good. Drops a lot of things.  Having pressure headaches.  No appetite.  Small amounts of food causes abdomen distension. Feels like everything is backing up into throat. Swelling in face hands, feet and legs. Feels like pins and needles in hands. Pain in back and legs.  Urinates only 2 times a day.  States she drinks lots of water. Hair is thinning.  Skin looks grey. Sores not healing on her body. Tongue feels swollen at times.  States she has gained 20# in last week.  Sore on her head bleeds a lot.   No new diagnosis since last visit.

## 2015-12-15 NOTE — Progress Notes (Addendum)
[facility] day:  12/15/2015  Chief Complaint: Alison Maxwell is a 40 y.o. female with iron deficiency anemia and reactive thrombocytosis who is seen for reassessment.  HPI:  The patient has had iron deficiency for at least 2 years.  She was seen by Dr. Pablo Lawrence at [facility] on 11/04/2014.  She was noted to have iron deficiency with reactive thrombocytosis.  She had heavy menses.  Oral iron was recommended.  Labs included a hematocrit of 39.6, hemoglobin 12.1, MCV 78, platelets 532,000, WBC 10,400 with an ANC of 7000.  Ferritin was 6, iron saturation 6%, and TIBC 510 (high)  She was seen by Dr. Rodolph Bong on 11/04/2015.  Labs on 11/11/2015 revealed a hematocrit of 35.2, hemoglobin 10.9, MCV 72.1, platelets 483,000, WBC 12,100 with an ANC of 6900.  Absolute monocyte count was 1100.  Ferritin was 5, iron saturation of 4% and TIBC of 527 (high) c/w iron deficiency.    She received Venofer 200 mg IV on 11/11/2015 and 11/25/2015.  She states that "the infusions irritated everything".  She became "inflamed".  She underwent EGD and colonoscopy on 11/17/2015 by Dr. Jonathon Bellows.  EGD was normal.  There was a medium amount of food (residue) in the stomach.  Colonoscopy revealed a poor prep.  Repeat colonoscopy was scheduled in 1 week (not yet performed).  She states that she "can't drink the prep".  Regarding her diet, she notes no appetite.  She craves cold fruit and grapes.  She drinks a lot of water.  With the "slightest food" she "feels 9 months pregnant".  She is sensitive to sunlight.  Her skin easily blisters.  She has to wear sunglasses outside.  She notes night sweats.  She has temperatures between 97-101 associated with "flare ups".    When she has a flare up, she feels "she is not going to make it".  She notes a "pulling sensation" in her skin.  She has a pressure sensation inside her head "like my head is going to explode".   She describes  "gasping for breath" and chest pain at times.    She has been seen by multiple specialists in the past including psychiatry, dermatology, rheumatology, and hematology without clear pathology identified.  Patient was seen by Dr. Artis Delay of rheumatology at [facility] on 03/24/2013. She was noted to have Sjogren's syndrome.  ANA was 1:40.  She was treated with artificial tears, biotin and dental care. Plaquenil 400 mg a day was started.   She was last seen by Dr. Manuella Ghazi on 03/09/2015.  At that time, it was noted that her sicca symptoms had not improved on Plaquenil.  She had multiple other somatic complaints. Treatment was symptomatic (artificial tears/gel, biotene and dental care).  Her rash was unlikely autoimmune in etiology. Her workup for acute intermittent porphyria was normal. Her lesions were in reachable areas and likely exacerbated by scratching.  She had a skin biopsy.  She was diagnosed with prurigo nodularis. Stress and depression were making things worse. She was due to see a psychologist.  She was taking Cymbalta. Lyrica was very helpful with plan to continue at 75 mg twice daily  She is in the "process of switching doctors".  She feels like she needs to "go to urgent care".   Past Medical History:  Diagnosis Date  . Anemia   . Arthritis    hands  . Headache   . MRSA (methicillin resistant Staphylococcus aureus)  pt unsure if currently is MRSA positive or not  . Postpartum depression   . Seizures ([place])    none in over 4 yrs    Past Surgical History:  Procedure Laterality Date  . CESAREAN SECTION    . CESAREAN SECTION    . COLONOSCOPY WITH PROPOFOL N/A 11/17/2015   Procedure: COLONOSCOPY WITH PROPOFOL;  Surgeon: Jonathon Bellows, MD;  Location: [facility];  Service: Endoscopy;  Laterality: N/A;  INCOMPLETE PROCEDURE NO PREP  . ESOPHAGOGASTRODUODENOSCOPY (EGD) WITH PROPOFOL N/A 11/17/2015   Procedure: ESOPHAGOGASTRODUODENOSCOPY (EGD) WITH PROPOFOL;  Surgeon: Jonathon Bellows, MD;  Location: [facility];  Service: Endoscopy;  Laterality: N/A;  . KNEE SURGERY    . LIPOSUCTION    . TUBAL LIGATION      History reviewed. No pertinent family history.  Social History:  reports that she has never smoked. She has never used smokeless tobacco. She reports that she does not drink alcohol or use drugs.  She lives in [place] with her 2 children and mother.  The patient is alone today.  Allergies:  Allergies  Allergen Reactions  . Levetiracetam Hives and Itching    Pt reports pulmonary edema  . Erythromycin Base Nausea And Vomiting and Rash       . Sulfa Antibiotics Nausea And Vomiting and Rash         Current Medications: Current Outpatient Prescriptions  Medication Sig Dispense Refill  . acetaminophen (TYLENOL) 325 MG tablet Take by mouth.    . busPIRone (BUSPAR) 7.5 MG tablet Take by mouth.    . DULoxetine (CYMBALTA) 60 MG capsule Take 60 mg by mouth daily.    . hydrOXYzine (ATARAX/VISTARIL) 25 MG tablet TAKE 3 TABLETS (75 MG TOTAL) BY MOUTH 3 (THREE) TIMES DAILY AS NEEDED FOR ITCHING.    Marland Kitchen ibuprofen (ADVIL,MOTRIN) 800 MG tablet Take by mouth.    . pregabalin (LYRICA) 75 MG capsule Take 75 mg by mouth 2 (two) times daily.    . clindamycin (CLEOCIN T) 1 % lotion Apply topically.     No current facility-administered medications for this visit.     Review of Systems:  GENERAL:  Feels bad.  Sleeps 18-20 hours/day.  Temperature 97-101 with flares.  "Body is inflamed".  Night sweats.  Weight gain of 20 pounds in the past week. PERFORMANCE STATUS (ECOG):  1 HEENT:  Epistaxis.  Teeth breaking.  Dry eyes and mouth.  No visual changes, runny nose, sore throat, mouth sores or tenderness. Lungs: Shortness of breath at times.  No cough.  No hemoptysis. Cardiac:  Chest pain.  No palpitations, orthopnea, or PND. GI:  No appetite.  Nausea and vomiting 3 times a week.  No diarrhea, melena or hematochezia.  Two bowel movements since the colonoscopy. GU:  Skin  flakes in urine.  No urgency, frequency, dysuria, or hematuria.  No menses in > 6 months. Musculoskeletal:  Low back pain.  Joint flares.  No muscle tenderness. Extremities:  No pain or swelling. Skin:  Prurigo nodularis.  Skin flares.  Pulling sensation in skin.  Pruritic.  Areas on scalp not healing. Neuro:  General weakness.  No headache, numbness or weakness, balance or coordination issues. Endocrine:  No diabetes, thyroid issues, or hot flashes.  Night sweats. Psych:  Anxiety. Insomnia. Pain:  No focal pain. Review of systems:  All other systems reviewed and found to be negative.  Physical Exam: Blood pressure 117/84, pulse 96, temperature 98.6 F (37 C), temperature source Tympanic, resp. rate 20, weight  192 lb 14.4 oz (87.5 kg), last menstrual period 05/10/2015. GENERAL:  Well developed, well nourished, sitting comfortably in the exam room in no acute distress.  She is tearful at times. MENTAL STATUS:  Alert and oriented to person, place and time. HEAD:  Brown hair pulled up.  Normocephalic, atraumatic, face symmetric, no Cushingoid features. EYES:  Brown eyes.  Pupils equal round and reactive to light and accomodation.  No conjunctivitis or scleral icterus. ENT:  Oropharynx clear without lesion.  Tongue normal. Mucous membranes moist.  RESPIRATORY:  Clear to auscultation without rales, wheezes or rhonchi. CARDIOVASCULAR:  Regular rate and rhythm without murmur, rub or gallop. ABDOMEN:  Soft, non-tender, with active bowel sounds, and no appreciable hepatosplenomegaly.  Questionable fullness in LUQ.  No masses. SKIN:  Scarring on upper extremities. EXTREMITIES: No edema, no skin discoloration or tenderness.  No palpable cords. LYMPH NODES: No palpable cervical, supraclavicular, axillary or inguinal adenopathy  NEUROLOGICAL: Unremarkable. PSYCH:  Anxious.  Tearful at times.   Appointment on 12/15/2015  Component Date Value Ref Range Status  . WBC 12/15/2015 9.3  3.6 - 11.0 K/uL  Final  . RBC 12/15/2015 4.84  3.80 - 5.20 MIL/uL Final  . Hemoglobin 12/15/2015 12.0  12.0 - 16.0 g/dL Final  . HCT 12/15/2015 37.5  35.0 - 47.0 % Final  . MCV 12/15/2015 77.5* 80.0 - 100.0 fL Final  . MCH 12/15/2015 24.8* 26.0 - 34.0 pg Final  . MCHC 12/15/2015 32.0  32.0 - 36.0 g/dL Final  . RDW 12/15/2015 24.7* 11.5 - 14.5 % Final  . Platelets 12/15/2015 367  150 - 440 K/uL Final  . Neutrophils Relative % 12/15/2015 50  % Final  . Neutro Abs 12/15/2015 4.6  1.4 - 6.5 K/uL Final  . Lymphocytes Relative 12/15/2015 35  % Final  . Lymphs Abs 12/15/2015 3.3  1.0 - 3.6 K/uL Final  . Monocytes Relative 12/15/2015 10  % Final  . Monocytes Absolute 12/15/2015 0.9  0.2 - 0.9 K/uL Final  . Eosinophils Relative 12/15/2015 4  % Final  . Eosinophils Absolute 12/15/2015 0.4  0 - 0.7 K/uL Final  . Basophils Relative 12/15/2015 1  % Final  . Basophils Absolute 12/15/2015 0.1  0 - 0.1 K/uL Final    Assessment:  Alison Maxwell is a 40 y.o. female with iron deficiency for at least 2 years.  Initial etiology was felt secondary to menorrhagia.  However, she has had no menses in > 6 months.  Diet is poor.  She denies any melena, hematochezia or hematuria.  Work-up on 11/11/2015 revealed a hematocrit of 35.2, hemoglobin 10.9, MCV 72.1, platelets 483,000, WBC 12,100 with an ANC of 6900.  Absolute monocyte count was 1100.  Ferritin was 5, iron saturation of 4% and TIBC of 527 (high) c/w iron deficiency.    She received Venofer 200 mg IV on 11/11/2015 and 11/25/2015.  She states that "the infusions irritated everything".  She became "inflamed".  She has been seen by multiple specialists (psychiatry, dermatology, rheumatology, and hematology) in the past.  Review of systems is diffusely positive.  She feels like her entire body is "inflamed".  ANA was 1:40 in 2015.  She has prurigo nodularis.  Exam reveals questionable fullness in the left upper quadrant.  Plan: 1.  Review entire medical history, diagnosis of  iron deficiency and treatment to date.  Hematocrit is normal. MCV is slightly low. Given her issues with IV iron, I would not proceed with additional IV iron.  I suspect  her iron deficiency is due to her diet. I encouraged her to follow-up with GI regarding her colonoscopy. 2.  Labs today:  CBC with diff, ferritin, iron studies, MMA, folate, vitamin B1. 3.  Abdominal ultrasound 4.  Consult rheumatology re: second opinion.  Patient with a "positive ANA in 2015" (results available after clinic). 5.  RTC in 1 week for MD assessment, labs (CMP, LDH, uric acid, ESR, ANA with reflex), and review of ultrasound  Addendum:  Abdominal ultrasound on 12/15/2015 revealed increased liver echotexture suggesting hepatic steatosis. There were no focal lesions present.  Spleen size was normal.   Alison Asal, MD  12/15/2015, 12:18 PM

## 2015-12-16 ENCOUNTER — Ambulatory Visit: Payer: Self-pay

## 2015-12-16 ENCOUNTER — Other Ambulatory Visit: Payer: Self-pay

## 2015-12-17 LAB — METHYLMALONIC ACID, SERUM: Methylmalonic Acid, Quantitative: 329 nmol/L (ref 0–378)

## 2015-12-19 LAB — VITAMIN B1: Vitamin B1 (Thiamine): 141.3 nmol/L (ref 66.5–200.0)

## 2015-12-22 ENCOUNTER — Ambulatory Visit: Payer: Self-pay | Admitting: Hematology and Oncology

## 2015-12-22 ENCOUNTER — Other Ambulatory Visit: Payer: Self-pay

## 2015-12-22 NOTE — Progress Notes (Deleted)
[facility] day:  12/22/2015  Chief Complaint: Alison Maxwell is a 40 y.o. female with iron deficiency anemia and reactive thrombocytosis who is seen for review of interval testing and discussion regarding direction of therapy.  HPI:  The patient was last seen in the hematology clinic on 12/15/2015.  At that time, she was seen for initial assessment by me.  She has iron deficiency for at least 3 years.  Diet was poor.  EGD on 11/17/2015 was normal.  Colonoscopy on 11/17/2015 was a poor prep.  Colonoscopy was to be rescheduled.  Labs included a hematocrit 37.5, hemoglobin 11.2, MCV 77.5, platelets 367,000, white count 9300 with an ANC of 4600.  Ferritin was 33 (improved).  Iron studies included a saturation of 11% and a TIBC of 431.  B1 was 141.3 (normal).  Folate was 10.  Methylmalonic acid was 329 (normal) thus ruling out B12 deficiency.  She noted a multitude of symptoms.  Exam revealed fullness in the left upper quadrant.  Abdominal ultrasound on 12/15/2015 revealed increased liver echotexture suggesting hepatic steatosis. There were no focal lesions present.  Spleen size was normal.   Past Medical History:  Diagnosis Date  . Anemia   . Arthritis    hands  . Headache   . MRSA (methicillin resistant Staphylococcus aureus)    pt unsure if currently is MRSA positive or not  . Postpartum depression   . Seizures ([place])    none in over 4 yrs    Past Surgical History:  Procedure Laterality Date  . CESAREAN SECTION    . CESAREAN SECTION    . COLONOSCOPY WITH PROPOFOL N/A 11/17/2015   Procedure: COLONOSCOPY WITH PROPOFOL;  Surgeon: Jonathon Bellows, MD;  Location: [facility];  Service: Endoscopy;  Laterality: N/A;  INCOMPLETE PROCEDURE NO PREP  . ESOPHAGOGASTRODUODENOSCOPY (EGD) WITH PROPOFOL N/A 11/17/2015   Procedure: ESOPHAGOGASTRODUODENOSCOPY (EGD) WITH PROPOFOL;  Surgeon: Jonathon Bellows, MD;  Location: [facility];  Service:  Endoscopy;  Laterality: N/A;  . KNEE SURGERY    . LIPOSUCTION    . TUBAL LIGATION      No family history on file.  Social History:  reports that she has never smoked. She has never used smokeless tobacco. She reports that she does not drink alcohol or use drugs.  She lives in [place] with her 2 children and mother.  The patient is alone today.  Allergies:  Allergies  Allergen Reactions  . Levetiracetam Hives and Itching    Pt reports pulmonary edema  . Erythromycin Base Nausea And Vomiting and Rash       . Sulfa Antibiotics Nausea And Vomiting and Rash         Current Medications: Current Outpatient Prescriptions  Medication Sig Dispense Refill  . acetaminophen (TYLENOL) 325 MG tablet Take by mouth.    . busPIRone (BUSPAR) 7.5 MG tablet Take by mouth.    . clindamycin (CLEOCIN T) 1 % lotion Apply topically.    . DULoxetine (CYMBALTA) 60 MG capsule Take 60 mg by mouth daily.    . hydrOXYzine (ATARAX/VISTARIL) 25 MG tablet TAKE 3 TABLETS (75 MG TOTAL) BY MOUTH 3 (THREE) TIMES DAILY AS NEEDED FOR ITCHING.    Marland Kitchen ibuprofen (ADVIL,MOTRIN) 800 MG tablet Take by mouth.    . pregabalin (LYRICA) 75 MG capsule Take 75 mg by mouth 2 (two) times daily.     No current facility-administered medications for this visit.     Review of Systems:  GENERAL:  Feels bad.  Sleeps 18-20 hours/day.  Temperature 97-101 with flares.  "Body is inflamed".  Night sweats.  Weight gain of 20 pounds in the past week. PERFORMANCE STATUS (ECOG):  1 HEENT:  Epistaxis.  Teeth breaking.  Dry eyes and mouth.  No visual changes, runny nose, sore throat, mouth sores or tenderness. Lungs: Shortness of breath at times.  No cough.  No hemoptysis. Cardiac:  Chest pain.  No palpitations, orthopnea, or PND. GI:  No appetite.  Nausea and vomiting 3 times a week.  No diarrhea, melena or hematochezia.  Two bowel movements since the colonoscopy. GU:  Skin flakes in urine.  No urgency, frequency, dysuria, or hematuria.  No menses  in > 6 months. Musculoskeletal:  Low back pain.  Joint flares.  No muscle tenderness. Extremities:  No pain or swelling. Skin:  Prurigo nodularis.  Skin flares.  Pulling sensation in skin.  Pruritic.  Areas on scalp not healing. Neuro:  General weakness.  No headache, numbness or weakness, balance or coordination issues. Endocrine:  No diabetes, thyroid issues, or hot flashes.  Night sweats. Psych:  Anxiety. Insomnia. Pain:  No focal pain. Review of systems:  All other systems reviewed and found to be negative.  Physical Exam: There were no vitals taken for this visit. GENERAL:  Well developed, well nourished, sitting comfortably in the exam room in no acute distress.  She is tearful at times. MENTAL STATUS:  Alert and oriented to person, place and time. HEAD:  Brown hair pulled up.  Normocephalic, atraumatic, face symmetric, no Cushingoid features. EYES:  Brown eyes.  Pupils equal round and reactive to light and accomodation.  No conjunctivitis or scleral icterus. ENT:  Oropharynx clear without lesion.  Tongue normal. Mucous membranes moist.  RESPIRATORY:  Clear to auscultation without rales, wheezes or rhonchi. CARDIOVASCULAR:  Regular rate and rhythm without murmur, rub or gallop. ABDOMEN:  Soft, non-tender, with active bowel sounds, and no appreciable hepatosplenomegaly.  Questionable fullness in LUQ.  No masses. SKIN:  Scarring on upper extremities. EXTREMITIES: No edema, no skin discoloration or tenderness.  No palpable cords. LYMPH NODES: No palpable cervical, supraclavicular, axillary or inguinal adenopathy  NEUROLOGICAL: Unremarkable. PSYCH:  Anxious.  Tearful at times.   No visits with results within 3 Day(s) from this visit.  Latest known visit with results is:  Appointment on 12/15/2015  Component Date Value Ref Range Status  . WBC 12/15/2015 9.3  3.6 - 11.0 K/uL Final  . RBC 12/15/2015 4.84  3.80 - 5.20 MIL/uL Final  . Hemoglobin 12/15/2015 12.0  12.0 - 16.0 g/dL Final   . HCT 12/15/2015 37.5  35.0 - 47.0 % Final  . MCV 12/15/2015 77.5* 80.0 - 100.0 fL Final  . MCH 12/15/2015 24.8* 26.0 - 34.0 pg Final  . MCHC 12/15/2015 32.0  32.0 - 36.0 g/dL Final  . RDW 12/15/2015 24.7* 11.5 - 14.5 % Final  . Platelets 12/15/2015 367  150 - 440 K/uL Final  . Neutrophils Relative % 12/15/2015 50  % Final  . Neutro Abs 12/15/2015 4.6  1.4 - 6.5 K/uL Final  . Lymphocytes Relative 12/15/2015 35  % Final  . Lymphs Abs 12/15/2015 3.3  1.0 - 3.6 K/uL Final  . Monocytes Relative 12/15/2015 10  % Final  . Monocytes Absolute 12/15/2015 0.9  0.2 - 0.9 K/uL Final  . Eosinophils Relative 12/15/2015 4  % Final  . Eosinophils Absolute 12/15/2015 0.4  0 - 0.7 K/uL Final  . Basophils Relative 12/15/2015 1  %  Final  . Basophils Absolute 12/15/2015 0.1  0 - 0.1 K/uL Final  . Ferritin 12/15/2015 33  11 - 307 ng/mL Final  . Iron 12/15/2015 47  28 - 170 ug/dL Final  . TIBC 12/15/2015 431  250 - 450 ug/dL Final  . Saturation Ratios 12/15/2015 11  10.4 - 31.8 % Final  . UIBC 12/15/2015 385  ug/dL Final  . Folate 12/15/2015 10.0  >5.9 ng/mL Final  . Methylmalonic Acid, Quantitative 12/17/2015 329  0 - 378 nmol/L Final   Comment: (NOTE) Performed At: [facility] [place], [place] [id number] Lindon Romp MD EH:[id number]   . Vitamin B1 (Thiamine) 12/19/2015 141.3  66.5 - 200.0 nmol/L Final   Comment: (NOTE) This test was developed and its performance characteristics determined by [facility]. It has not been cleared or approved by the Food and Drug Administration. Performed At: [facility] [address] [place], [place] [id number] Lindon Romp MD YY:[id number]     Assessment:  Alison Maxwell is a 40 y.o. female with iron deficiency for at least 2 years.  Initial etiology was felt secondary to menorrhagia.  However, she has had no menses in > 6 months.  Diet is poor.  She denies any melena, hematochezia or hematuria.  Work-up on 11/11/2015  revealed a hematocrit of 35.2, hemoglobin 10.9, MCV 72.1, platelets 483,000, WBC 12,100 with an ANC of 6900.  Absolute monocyte count was 1100.  Ferritin was 5, iron saturation of 4% and TIBC of 527 (high) c/w iron deficiency.    She received Venofer 200 mg IV on 11/11/2015 and 11/25/2015.  She states that "the infusions irritated everything".  She became "inflamed".  EGD on 11/17/2015 was normal.  There was a medium amount of food (residue) in the stomach.  Colonoscopy on 11/17/2015 revealed a poor prep.  Repeat colonoscopy was scheduled in 1 week (not yet performed).    Abdominal ultrasound on 12/15/2015 revealed increased liver echotexture suggesting hepatic steatosis. There were no focal lesions present.  Spleen size was normal.  She has been seen by multiple specialists (psychiatry, dermatology, rheumatology, and hematology) in the past.  Review of systems is diffusely positive.  She feels like her entire body is "inflamed".  ANA was 1:40 in 2015.  She has prurigo nodularis.  Exam reveals questionable fullness in the left upper quadrant.  Plan: 1.  Labs: CBC with diff, LDH, uric acid, ESR, ANA with reflex. 2.  Review labs and ultrasound from last visit.   entire medical history, diagnosis of iron deficiency and treatment to date.  Hematocrit is normal. MCV is slightly low. Given her issues with IV iron, I would not proceed with additional IV iron.  I suspect her iron deficiency is due to her diet. I encouraged her to follow-up with GI regarding her colonoscopy. 2.  Labs today:  CBC with diff, ferritin, iron studies, MMA, folate, vitamin B1. 3.  Abdominal ultrasound 4.  Consult rheumatology re: second opinion.  Patient with a "positive ANA in 2015" (results available after clinic). 5.  RTC in 1 week for MD assessment, labs (CMP, LDH, uric acid, ESR, ANA with reflex), and review of ultrasound  Addendum:  Abdominal ultrasound on 12/15/2015 revealed increased liver echotexture suggesting  hepatic steatosis. There were no focal lesions present.  Spleen size was normal.   Lequita Asal, MD  12/22/2015, 6:32 AM

## 2015-12-27 ENCOUNTER — Ambulatory Visit: Payer: Self-pay | Admitting: Hematology and Oncology

## 2015-12-27 ENCOUNTER — Other Ambulatory Visit: Payer: Self-pay

## 2015-12-28 ENCOUNTER — Telehealth: Payer: Self-pay | Admitting: *Deleted

## 2015-12-28 NOTE — Telephone Encounter (Signed)
[facility] Rheumatology to confirm receipt of consult from Dr. Mike Gip.  Levada Dy at [facility] reported that they have tried to call Nak[place] once to make a new patient appointment and they will call two more times to make this appointment for her.

## 2016-01-07 ENCOUNTER — Inpatient Hospital Stay (HOSPITAL_BASED_OUTPATIENT_CLINIC_OR_DEPARTMENT_OTHER): Payer: Medicaid Other | Admitting: Hematology and Oncology

## 2016-01-07 ENCOUNTER — Inpatient Hospital Stay: Payer: Medicaid Other

## 2016-01-07 ENCOUNTER — Encounter: Payer: Self-pay | Admitting: Hematology and Oncology

## 2016-01-07 VITALS — BP 115/73 | HR 92 | Temp 97.0°F | Resp 18 | Wt 190.9 lb

## 2016-01-07 DIAGNOSIS — D473 Essential (hemorrhagic) thrombocythemia: Secondary | ICD-10-CM

## 2016-01-07 DIAGNOSIS — R05 Cough: Secondary | ICD-10-CM

## 2016-01-07 DIAGNOSIS — M797 Fibromyalgia: Secondary | ICD-10-CM

## 2016-01-07 DIAGNOSIS — R51 Headache: Secondary | ICD-10-CM

## 2016-01-07 DIAGNOSIS — R079 Chest pain, unspecified: Secondary | ICD-10-CM | POA: Diagnosis not present

## 2016-01-07 DIAGNOSIS — R0602 Shortness of breath: Secondary | ICD-10-CM | POA: Diagnosis not present

## 2016-01-07 DIAGNOSIS — D509 Iron deficiency anemia, unspecified: Secondary | ICD-10-CM

## 2016-01-07 DIAGNOSIS — D500 Iron deficiency anemia secondary to blood loss (chronic): Secondary | ICD-10-CM

## 2016-01-07 DIAGNOSIS — D649 Anemia, unspecified: Secondary | ICD-10-CM

## 2016-01-07 DIAGNOSIS — Z8669 Personal history of other diseases of the nervous system and sense organs: Secondary | ICD-10-CM

## 2016-01-07 DIAGNOSIS — R61 Generalized hyperhidrosis: Secondary | ICD-10-CM

## 2016-01-07 DIAGNOSIS — R52 Pain, unspecified: Secondary | ICD-10-CM

## 2016-01-07 DIAGNOSIS — M069 Rheumatoid arthritis, unspecified: Secondary | ICD-10-CM | POA: Diagnosis not present

## 2016-01-07 DIAGNOSIS — R140 Abdominal distension (gaseous): Secondary | ICD-10-CM | POA: Diagnosis not present

## 2016-01-07 DIAGNOSIS — M3500 Sicca syndrome, unspecified: Secondary | ICD-10-CM

## 2016-01-07 DIAGNOSIS — Z79899 Other long term (current) drug therapy: Secondary | ICD-10-CM

## 2016-01-07 DIAGNOSIS — R918 Other nonspecific abnormal finding of lung field: Secondary | ICD-10-CM

## 2016-01-07 DIAGNOSIS — F329 Major depressive disorder, single episode, unspecified: Secondary | ICD-10-CM

## 2016-01-07 LAB — COMPREHENSIVE METABOLIC PANEL
ALT: 13 U/L — ABNORMAL LOW (ref 14–54)
AST: 21 U/L (ref 15–41)
Albumin: 3.8 g/dL (ref 3.5–5.0)
Alkaline Phosphatase: 71 U/L (ref 38–126)
Anion gap: 6 (ref 5–15)
BUN: 10 mg/dL (ref 6–20)
CO2: 25 mmol/L (ref 22–32)
Calcium: 8.8 mg/dL — ABNORMAL LOW (ref 8.9–10.3)
Chloride: 105 mmol/L (ref 101–111)
Creatinine, Ser: 0.67 mg/dL (ref 0.44–1.00)
GFR calc Af Amer: >60 mL/min (ref >60)
GFR calc non Af Amer: >60 mL/min (ref >60)
Glucose, Bld: 97 mg/dL (ref 65–99)
Potassium: 4.4 mmol/L (ref 3.5–5.1)
Sodium: 136 mmol/L (ref 135–145)
Total Bilirubin: 0.3 mg/dL (ref 0.3–1.2)
Total Protein: 7.4 g/dL (ref 6.5–8.1)

## 2016-01-07 LAB — URIC ACID: Uric Acid, Serum: 5 mg/dL (ref 2.3–6.6)

## 2016-01-07 LAB — SEDIMENTATION RATE: Sed Rate: 18 mm/hr (ref 0–20)

## 2016-01-07 LAB — LACTATE DEHYDROGENASE: LDH: 166 U/L (ref 98–192)

## 2016-01-07 NOTE — Progress Notes (Signed)
[facility] day:  01/07/2016  Chief Complaint: Alison Maxwell is a 40 y.o. female with iron deficiency anemia and reactive thrombocytosis who is seen for review of interval testing and discussion regarding direction of therapy.  HPI:  The patient was last seen in the hematology clinic on 12/15/2015.  At that time, she was seen for initial assessment by me.  She described iron deficiency for at least 3 years.  Diet was poor.  EGD on 11/17/2015 was normal.  Colonoscopy on 11/17/2015 was a poor prep.  Colonoscopy was to be rescheduled.  Labs included a hematocrit 37.5, hemoglobin 11.2, MCV 77.5, platelets 367,000, white count 9300 with an ANC of 4600.  Ferritin was 33 (improved).  Iron studies included a saturation of 11% and a TIBC of 431.  B1 was 141.3 (normal).  Folate was 10.  Methylmalonic acid was 329 (normal) thus ruling out B12 deficiency.  She noted numerous symptoms at last visit.  Exam revealed fullness in the left upper quadrant.  Abdominal ultrasound on 12/15/2015 revealed increased liver echotexture suggesting hepatic steatosis. There were no focal lesions present.  Spleen size was normal.  During the interim, she notes some chest pain described as stabbing and pronounced when she moved or talked.  She has been seen by [facility] rheumatology and diagnosed with fibromyalgia, rheumatoid arthritis and Sjogren's.  She has been referred to dermatology and pulmonary medicine.  She describes shortness of breath for awhile.  She has a bad cough.  She is "coming off a flare up".  She has spots on her scalp.  She notes a headache with a pressure sensation.  She is not eating well.  She has not had any menses.  She denies pregnancy.  Sleep is poor.   Past Medical History:  Diagnosis Date  . Anemia   . Arthritis    hands  . Headache   . MRSA (methicillin resistant Staphylococcus aureus)    pt unsure if currently is MRSA positive or not  . Postpartum  depression   . Seizures ([place])    none in over 4 yrs    Past Surgical History:  Procedure Laterality Date  . CESAREAN SECTION    . CESAREAN SECTION    . COLONOSCOPY WITH PROPOFOL N/A 11/17/2015   Procedure: COLONOSCOPY WITH PROPOFOL;  Surgeon: Jonathon Bellows, MD;  Location: [facility];  Service: Endoscopy;  Laterality: N/A;  INCOMPLETE PROCEDURE NO PREP  . ESOPHAGOGASTRODUODENOSCOPY (EGD) WITH PROPOFOL N/A 11/17/2015   Procedure: ESOPHAGOGASTRODUODENOSCOPY (EGD) WITH PROPOFOL;  Surgeon: Jonathon Bellows, MD;  Location: [facility];  Service: Endoscopy;  Laterality: N/A;  . KNEE SURGERY    . LIPOSUCTION    . TUBAL LIGATION      History reviewed. No pertinent family history.  Social History:  reports that she has never smoked. She has never used smokeless tobacco. She reports that she does not drink alcohol or use drugs.  She lives in [place] with her 2 children and mother.  The patient is alone today.  Allergies:  Allergies  Allergen Reactions  . Levetiracetam Hives and Itching    Pt reports pulmonary edema  . Erythromycin Base Nausea And Vomiting and Rash       . Sulfa Antibiotics Nausea And Vomiting and Rash         Current Medications: Current Outpatient Prescriptions  Medication Sig Dispense Refill  . acetaminophen (TYLENOL) 325 MG tablet Take by mouth.    . busPIRone (BUSPAR)  7.5 MG tablet Take by mouth.    . clindamycin (CLEOCIN T) 1 % lotion Apply topically.    . DULoxetine (CYMBALTA) 60 MG capsule Take 60 mg by mouth daily.    . hydrOXYzine (ATARAX/VISTARIL) 25 MG tablet TAKE 3 TABLETS (75 MG TOTAL) BY MOUTH 3 (THREE) TIMES DAILY AS NEEDED FOR ITCHING.    Marland Kitchen ibuprofen (ADVIL,MOTRIN) 800 MG tablet Take by mouth.    . pilocarpine (SALAGEN) 5 MG tablet Take 5 mg by mouth.    . pregabalin (LYRICA) 75 MG capsule Take 75 mg by mouth 2 (two) times daily.     No current facility-administered medications for this visit.     Review of Systems:  GENERAL:  Feels bad.   Sleeps poorly.  Fevers with flares.  Weight up 2 pounds since last visit. PERFORMANCE STATUS (ECOG):  1 HEENT:  Epistaxis.  Teeth breaking.  Dry eyes and mouth.  No visual changes, runny nose, sore throat, mouth sores or tenderness. Lungs: Shortness of breath.  Bad cough.  No hemoptysis. Cardiac:  Chest pain described as "stabbing pain".  No palpitations, orthopnea, or PND. GI:  Not eating well.  No diarrhea, melena or hematochezia. GU:  No urgency, frequency, dysuria, or hematuria.  No menses in > 6 months. Musculoskeletal:  Low back pain.  Joint flares.  No muscle tenderness. Extremities:  No pain or swelling. Skin:  Prurigo nodularis.  Skin flares.  Pulling sensation in skin.  Pruritic.  Areas on scalp not healing. Neuro:  General weakness.  Headache pressure.  No numbness or weakness, balance or coordination issues. Endocrine:  No diabetes, thyroid issues, or hot flashes.  Psych:  Anxiety. Insomnia. Pain:  No focal pain. Review of systems:  All other systems reviewed and found to be negative.  Physical Exam: Blood pressure 115/73, pulse 92, temperature 97 F (36.1 C), temperature source Tympanic, resp. rate 18, weight 190 lb 14.7 oz (86.6 kg). GENERAL:  Well developed, well nourished, woman sitting comfortably in the exam room in no acute distress. MENTAL STATUS:  Alert and oriented to person, place and time. HEAD:  Brown hair pulled up.  Normocephalic, atraumatic, face symmetric, no Cushingoid features. EYES:  Brown eyes.  No conjunctivitis or scleral icterus. NEUROLOGICAL: Unremarkable. PSYCH:  Anxious.  Tearful at times.   Appointment on 01/07/2016  Component Date Value Ref Range Status  . LDH 01/07/2016 166  98 - 192 U/L Final  . Sodium 01/07/2016 136  135 - 145 mmol/L Final  . Potassium 01/07/2016 4.4  3.5 - 5.1 mmol/L Final  . Chloride 01/07/2016 105  101 - 111 mmol/L Final  . CO2 01/07/2016 25  22 - 32 mmol/L Final  . Glucose, Bld 01/07/2016 97  65 - 99 mg/dL Final  .  BUN 01/07/2016 10  6 - 20 mg/dL Final  . Creatinine, Ser 01/07/2016 0.67  0.44 - 1.00 mg/dL Final  . Calcium 01/07/2016 8.8* 8.9 - 10.3 mg/dL Final  . Total Protein 01/07/2016 7.4  6.5 - 8.1 g/dL Final  . Albumin 01/07/2016 3.8  3.5 - 5.0 g/dL Final  . AST 01/07/2016 21  15 - 41 U/L Final  . ALT 01/07/2016 13* 14 - 54 U/L Final  . Alkaline Phosphatase 01/07/2016 71  38 - 126 U/L Final  . Total Bilirubin 01/07/2016 0.3  0.3 - 1.2 mg/dL Final  . GFR calc non Af Amer 01/07/2016 >60  >60 mL/min Final  . GFR calc Af Amer 01/07/2016 >60  >60 mL/min Final  Comment: (NOTE) The eGFR has been calculated using the CKD EPI equation. This calculation has not been validated in all clinical situations. eGFR's persistently <60 mL/min signify possible Chronic Kidney Disease.   . Anion gap 01/07/2016 6  5 - 15 Final  . Uric Acid, Serum 01/07/2016 5.0  2.3 - 6.6 mg/dL Final  . Sed Rate 01/07/2016 18  0 - 20 mm/hr Final    Assessment:  Jyssica Rief is a 40 y.o. female with iron deficiency for at least 2 years.  Initial etiology was felt secondary to menorrhagia.  However, she has had no menses in > 6 months.  Diet is poor.  She denies any melena, hematochezia or hematuria.  Work-up on 11/11/2015 revealed a hematocrit of 35.2, hemoglobin 10.9, MCV 72.1, platelets 483,000, WBC 12,100 with an ANC of 6900.  Absolute monocyte count was 1100.  Ferritin was 5, iron saturation of 4% and TIBC of 527 (high) c/w iron deficiency.    Labs on 12/15/2015 revealed a hematocrit 37.5, hemoglobin 11.2, MCV 77.5, platelets 367,000, white count 9300 with an [place] of 4600.  Ferritin was 33 (improved).  Iron studies included a saturation of 11% and a TIBC of 431.  B1 was 141.3 (normal).  Folate was 10.  Methylmalonic acid was 329 (normal) thus ruling out B12 deficiency.  Abdominal ultrasound on 12/15/2015 revealed increased liver echotexture suggesting hepatic steatosis. There were no focal lesions.  Spleen size was  normal.  She received Venofer 200 mg IV on 11/11/2015 and 11/25/2015.  She states that "the infusions irritated everything".  She became "inflamed".  EGD on 11/17/2015 was normal.  There was a medium amount of food (residue) in the stomach.  Colonoscopy on 11/17/2015 revealed a poor prep.  Repeat colonoscopy was scheduled in 1 week (not yet performed).    Abdominal ultrasound on 12/15/2015 revealed increased liver echotexture suggesting hepatic steatosis. There were no focal lesions present.  Spleen size was normal.  She has been seen by multiple specialists (psychiatry, dermatology, rheumatology, and hematology) in the past.  Review of systems is diffusely positive.  Rheumatology evaluation is underway.  Hematocrit is normal.  Iron stores are improving.  Plan: 1.  Labs: CBC with diff, LDH, uric acid, ESR, ANA with reflex. 2.  Review labs and ultrasound from last visit.  Discuss improving hematocrit and iron stores (ferritin). No evidence of B12 deficiency.  Spleen is normal. 3.  RTC in 3-4 months for MD assess and labs (CBC with diff, ferritin, +/- others).   Lequita Asal, MD  01/07/2016, 11:44 AM
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
## 2016-01-08 LAB — ANA W/REFLEX: Anti Nuclear Antibody(ANA): NEGATIVE
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
## 2016-02-02 ENCOUNTER — Encounter: Payer: Self-pay | Admitting: Hematology and Oncology

## 2016-02-03 ENCOUNTER — Telehealth: Payer: Self-pay | Admitting: *Deleted

## 2016-02-03 NOTE — Telephone Encounter (Signed)
Called patient and LVM that she can call directly to GI for appointment or contact PCP to make referral if she needs one.

## 2016-02-09 DIAGNOSIS — L281 Prurigo nodularis: Secondary | ICD-10-CM | POA: Diagnosis not present

## 2016-02-09 DIAGNOSIS — L0100 Impetigo, unspecified: Secondary | ICD-10-CM | POA: Diagnosis not present

## 2016-02-15 DIAGNOSIS — F419 Anxiety disorder, unspecified: Secondary | ICD-10-CM | POA: Diagnosis not present

## 2016-02-15 DIAGNOSIS — K760 Fatty (change of) liver, not elsewhere classified: Secondary | ICD-10-CM | POA: Diagnosis not present

## 2016-02-15 DIAGNOSIS — K921 Melena: Secondary | ICD-10-CM | POA: Diagnosis not present

## 2016-04-06 ENCOUNTER — Inpatient Hospital Stay: Payer: Medicaid Other

## 2016-04-06 ENCOUNTER — Inpatient Hospital Stay: Payer: Medicaid Other | Admitting: Hematology and Oncology

## 2016-04-06 NOTE — Progress Notes (Unsigned)
[facility] day:  04/06/2016  Chief Complaint: Alison Maxwell is a 41 y.o. female with iron deficiency anemia and reactive thrombocytosis who is seen for 3 month assessment.  HPI:  The patient was last seen in the hematology clinic on 01/07/2016.  At that time,   Labs at last visit included a normal CMP, LDH, uric acid, sed rate. ANA with reflex.   she was seen for initial assessment by me.  She described iron deficiency for at least 3 years.  Diet was poor.  EGD on 11/17/2015 was normal.  Colonoscopy on 11/17/2015 was a poor prep.  Colonoscopy was to be rescheduled.  Labs included a hematocrit 37.5, hemoglobin 11.2, MCV 77.5, platelets 367,000, white count 9300 with an ANC of 4600.  Ferritin was 33 (improved).  Iron studies included a saturation of 11% and a TIBC of 431.  B1 was 141.3 (normal).  Folate was 10.  Methylmalonic acid was 329 (normal) thus ruling out B12 deficiency.  She noted numerous symptoms at last visit.  Exam revealed fullness in the left upper quadrant.  Abdominal ultrasound on 12/15/2015 revealed increased liver echotexture suggesting hepatic steatosis. There were no focal lesions present.  Spleen size was normal.  During the interim, she notes some chest pain described as stabbing and pronounced when she moved or talked.  She has been seen by [facility] rheumatology and diagnosed with fibromyalgia, rheumatoid arthritis and Sjogren's.  She has been referred to dermatology and pulmonary medicine.  She describes shortness of breath for awhile.  She has a bad cough.  She is "coming off a flare up".  She has spots on her scalp.  She notes a headache with a pressure sensation.  She is not eating well.  She has not had any menses.  She denies pregnancy.  Sleep is poor.   Past Medical History:  Diagnosis Date  . Anemia   . Arthritis    hands  . Headache   . MRSA (methicillin resistant Staphylococcus aureus)    pt unsure if currently is  MRSA positive or not  . Postpartum depression   . Seizures ([place])    none in over 4 yrs    Past Surgical History:  Procedure Laterality Date  . CESAREAN SECTION    . CESAREAN SECTION    . COLONOSCOPY WITH PROPOFOL N/A 11/17/2015   Procedure: COLONOSCOPY WITH PROPOFOL;  Surgeon: Jonathon Bellows, MD;  Location: [place];  Service: Endoscopy;  Laterality: N/A;  INCOMPLETE PROCEDURE NO PREP  . ESOPHAGOGASTRODUODENOSCOPY (EGD) WITH PROPOFOL N/A 11/17/2015   Procedure: ESOPHAGOGASTRODUODENOSCOPY (EGD) WITH PROPOFOL;  Surgeon: Jonathon Bellows, MD;  Location: [place];  Service: Endoscopy;  Laterality: N/A;  . KNEE SURGERY    . LIPOSUCTION    . TUBAL LIGATION      No family history on file.  Social History:  reports that she has never smoked. She has never used smokeless tobacco. She reports that she does not drink alcohol or use drugs.  She lives in [place] with her 2 children and mother.  The patient is alone today.  Allergies:  Allergies  Allergen Reactions  . Levetiracetam Hives and Itching    Pt reports pulmonary edema  . Erythromycin Base Nausea And Vomiting and Rash       . Sulfa Antibiotics Nausea And Vomiting and Rash         Current Medications: Current Outpatient Prescriptions  Medication Sig Dispense Refill  . acetaminophen (TYLENOL)  325 MG tablet Take by mouth.    . busPIRone (BUSPAR) 7.5 MG tablet Take by mouth.    . clindamycin (CLEOCIN T) 1 % lotion Apply topically.    . DULoxetine (CYMBALTA) 60 MG capsule Take 60 mg by mouth daily.    . hydrOXYzine (ATARAX/VISTARIL) 25 MG tablet TAKE 3 TABLETS (75 MG TOTAL) BY MOUTH 3 (THREE) TIMES DAILY AS NEEDED FOR ITCHING.    Marland Kitchen ibuprofen (ADVIL,MOTRIN) 800 MG tablet Take by mouth.    . pilocarpine (SALAGEN) 5 MG tablet Take 5 mg by mouth.    . pregabalin (LYRICA) 75 MG capsule Take 75 mg by mouth 2 (two) times daily.     No current facility-administered medications for this visit.     Review of Systems:  GENERAL:   Feels bad.  Sleeps poorly.  Fevers with flares.  Weight up 2 pounds since last visit. PERFORMANCE STATUS (ECOG):  1 HEENT:  Epistaxis.  Teeth breaking.  Dry eyes and mouth.  No visual changes, runny nose, sore throat, mouth sores or tenderness. Lungs: Shortness of breath.  Bad cough.  No hemoptysis. Cardiac:  Chest pain described as "stabbing pain".  No palpitations, orthopnea, or PND. GI:  Not eating well.  No diarrhea, melena or hematochezia. GU:  No urgency, frequency, dysuria, or hematuria.  No menses in > 6 months. Musculoskeletal:  Low back pain.  Joint flares.  No muscle tenderness. Extremities:  No pain or swelling. Skin:  Prurigo nodularis.  Skin flares.  Pulling sensation in skin.  Pruritic.  Areas on scalp not healing. Neuro:  General weakness.  Headache pressure.  No numbness or weakness, balance or coordination issues. Endocrine:  No diabetes, thyroid issues, or hot flashes.  Psych:  Anxiety. Insomnia. Pain:  No focal pain. Review of systems:  All other systems reviewed and found to be negative.  Physical Exam: There were no vitals taken for this visit. GENERAL:  Well developed, well nourished, woman sitting comfortably in the exam room in no acute distress. MENTAL STATUS:  Alert and oriented to person, place and time. HEAD:  Brown hair pulled up.  Normocephalic, atraumatic, face symmetric, no Cushingoid features. EYES:  Brown eyes.  No conjunctivitis or scleral icterus. NEUROLOGICAL: Unremarkable. PSYCH:  Anxious.  Tearful at times.   No visits with results within 3 Day(s) from this visit.  Latest known visit with results is:  Appointment on 01/07/2016  Component Date Value Ref Range Status  . LDH 01/07/2016 166  98 - 192 U/L Final  . Sodium 01/07/2016 136  135 - 145 mmol/L Final  . Potassium 01/07/2016 4.4  3.5 - 5.1 mmol/L Final  . Chloride 01/07/2016 105  101 - 111 mmol/L Final  . CO2 01/07/2016 25  22 - 32 mmol/L Final  . Glucose, Bld 01/07/2016 97  65 - 99 mg/dL  Final  . BUN 01/07/2016 10  6 - 20 mg/dL Final  . Creatinine, Ser 01/07/2016 0.67  0.44 - 1.00 mg/dL Final  . Calcium 01/07/2016 8.8* 8.9 - 10.3 mg/dL Final  . Total Protein 01/07/2016 7.4  6.5 - 8.1 g/dL Final  . Albumin 01/07/2016 3.8  3.5 - 5.0 g/dL Final  . AST 01/07/2016 21  15 - 41 U/L Final  . ALT 01/07/2016 13* 14 - 54 U/L Final  . Alkaline Phosphatase 01/07/2016 71  38 - 126 U/L Final  . Total Bilirubin 01/07/2016 0.3  0.3 - 1.2 mg/dL Final  . GFR calc non Af Amer 01/07/2016 >60  >60 mL/min  Final  . GFR calc Af Amer 01/07/2016 >60  >60 mL/min Final   Comment: (NOTE) The eGFR has been calculated using the CKD EPI equation. This calculation has not been validated in all clinical situations. eGFR's persistently <60 mL/min signify possible Chronic Kidney Disease.   . Anion gap 01/07/2016 6  5 - 15 Final  . Uric Acid, Serum 01/07/2016 5.0  2.3 - 6.6 mg/dL Final  . Sed Rate 01/07/2016 18  0 - 20 mm/hr Final  . Anit Nuclear Antibody(ANA) 01/07/2016 Negative  Negative Final   Comment: (NOTE) Performed At: [facility] [address] [place], [place] [id number] Lindon Romp MD LK:[id number]     Assessment:  Alison Maxwell is a 41 y.o. female with iron deficiency for at least 2 years.  Initial etiology was felt secondary to menorrhagia.  However, she has had no menses in > 6 months.  Diet is poor.  She denies any melena, hematochezia or hematuria.  Work-up on 11/11/2015 revealed a hematocrit of 35.2, hemoglobin 10.9, MCV 72.1, platelets 483,000, WBC 12,100 with an ANC of 6900.  Absolute monocyte count was 1100.  Ferritin was 5, iron saturation of 4% and TIBC of 527 (high) c/w iron deficiency.    Labs on 12/15/2015 revealed a hematocrit 37.5, hemoglobin 11.2, MCV 77.5, platelets 367,000, white count 9300 with an [place] of 4600.  Ferritin was 33 (improved).  Iron studies included a saturation of 11% and a TIBC of 431.  B1 was 141.3 (normal).  Folate was 10.  Methylmalonic  acid was 329 (normal) thus ruling out B12 deficiency.  Abdominal ultrasound on 12/15/2015 revealed increased liver echotexture suggesting hepatic steatosis. There were no focal lesions.  Spleen size was normal.  She received Venofer 200 mg IV on 11/11/2015 and 11/25/2015.  She states that "the infusions irritated everything".  She became "inflamed".  EGD on 11/17/2015 was normal.  There was a medium amount of food (residue) in the stomach.  Colonoscopy on 11/17/2015 revealed a poor prep.  Repeat colonoscopy was scheduled in 1 week (not yet performed).    Abdominal ultrasound on 12/15/2015 revealed increased liver echotexture suggesting hepatic steatosis. There were no focal lesions present.  Spleen size was normal.  She has been seen by multiple specialists (psychiatry, dermatology, rheumatology, and hematology) in the past.  Review of systems is diffusely positive.  Rheumatology evaluation is underway.  Hematocrit is normal.  Iron stores are improving.  Plan: 1.  Labs: CBC with diff, ferritin, iron studies. 2.  Discuss need for colonoscopy.  Review labs and ultrasound from last visit.  Discuss improving hematocrit and iron stores (ferritin). No evidence of B12 deficiency.  Spleen is normal. 3.  RTC in 3-4 months for MD assess and labs (CBC with diff, ferritin, +/- others).   Lequita Asal, MD  04/06/2016, 2:37 AM

## 2016-04-11 DIAGNOSIS — H04123 Dry eye syndrome of bilateral lacrimal glands: Secondary | ICD-10-CM | POA: Diagnosis not present

## 2016-04-11 DIAGNOSIS — H2513 Age-related nuclear cataract, bilateral: Secondary | ICD-10-CM | POA: Diagnosis not present

## 2016-04-11 DIAGNOSIS — M3500 Sicca syndrome, unspecified: Secondary | ICD-10-CM | POA: Diagnosis not present

## 2016-04-11 DIAGNOSIS — G453 Amaurosis fugax: Secondary | ICD-10-CM | POA: Diagnosis not present

## 2016-04-17 DIAGNOSIS — R05 Cough: Secondary | ICD-10-CM | POA: Diagnosis not present

## 2016-04-17 DIAGNOSIS — R0600 Dyspnea, unspecified: Secondary | ICD-10-CM | POA: Diagnosis not present

## 2016-04-22 DIAGNOSIS — R000 Tachycardia, unspecified: Secondary | ICD-10-CM | POA: Diagnosis not present

## 2016-04-24 ENCOUNTER — Inpatient Hospital Stay: Payer: Medicaid Other | Admitting: Hematology and Oncology

## 2016-04-24 ENCOUNTER — Inpatient Hospital Stay: Payer: Medicaid Other

## 2016-04-28 DIAGNOSIS — K5909 Other constipation: Secondary | ICD-10-CM | POA: Diagnosis not present

## 2016-05-04 DIAGNOSIS — F419 Anxiety disorder, unspecified: Secondary | ICD-10-CM | POA: Diagnosis not present

## 2016-05-04 DIAGNOSIS — Z72820 Sleep deprivation: Secondary | ICD-10-CM | POA: Diagnosis not present

## 2016-05-04 DIAGNOSIS — F329 Major depressive disorder, single episode, unspecified: Secondary | ICD-10-CM | POA: Diagnosis not present

## 2016-05-04 DIAGNOSIS — K5900 Constipation, unspecified: Secondary | ICD-10-CM | POA: Diagnosis not present

## 2016-05-04 DIAGNOSIS — R109 Unspecified abdominal pain: Secondary | ICD-10-CM | POA: Diagnosis not present

## 2016-06-21 ENCOUNTER — Encounter: Payer: Self-pay | Admitting: Hematology and Oncology

## 2016-06-21 ENCOUNTER — Inpatient Hospital Stay: Payer: Medicaid Other | Attending: Hematology and Oncology

## 2016-06-21 ENCOUNTER — Inpatient Hospital Stay (HOSPITAL_BASED_OUTPATIENT_CLINIC_OR_DEPARTMENT_OTHER): Payer: Medicaid Other | Admitting: Hematology and Oncology

## 2016-06-21 VITALS — BP 106/74 | HR 94 | Temp 98.7°F | Resp 18 | Wt 207.9 lb

## 2016-06-21 DIAGNOSIS — Z8669 Personal history of other diseases of the nervous system and sense organs: Secondary | ICD-10-CM

## 2016-06-21 DIAGNOSIS — Z79899 Other long term (current) drug therapy: Secondary | ICD-10-CM | POA: Diagnosis not present

## 2016-06-21 DIAGNOSIS — D473 Essential (hemorrhagic) thrombocythemia: Secondary | ICD-10-CM

## 2016-06-21 DIAGNOSIS — R109 Unspecified abdominal pain: Secondary | ICD-10-CM | POA: Diagnosis not present

## 2016-06-21 DIAGNOSIS — R112 Nausea with vomiting, unspecified: Secondary | ICD-10-CM

## 2016-06-21 DIAGNOSIS — K5900 Constipation, unspecified: Secondary | ICD-10-CM | POA: Diagnosis not present

## 2016-06-21 DIAGNOSIS — A499 Bacterial infection, unspecified: Secondary | ICD-10-CM

## 2016-06-21 DIAGNOSIS — D509 Iron deficiency anemia, unspecified: Secondary | ICD-10-CM

## 2016-06-21 DIAGNOSIS — Z8619 Personal history of other infectious and parasitic diseases: Secondary | ICD-10-CM | POA: Diagnosis not present

## 2016-06-21 DIAGNOSIS — M129 Arthropathy, unspecified: Secondary | ICD-10-CM

## 2016-06-21 LAB — CBC WITH DIFFERENTIAL/PLATELET
Basophils Absolute: 0.1 10*3/uL (ref 0–0.1)
Basophils Relative: 1 %
Eosinophils Absolute: 0.2 10*3/uL (ref 0–0.7)
Eosinophils Relative: 3 %
HCT: 37.3 % (ref 35.0–47.0)
Hemoglobin: 12 g/dL (ref 12.0–16.0)
Lymphocytes Relative: 32 %
Lymphs Abs: 2.9 10*3/uL (ref 1.0–3.6)
MCH: 24.6 pg — ABNORMAL LOW (ref 26.0–34.0)
MCHC: 32.3 g/dL (ref 32.0–36.0)
MCV: 76.4 fL — ABNORMAL LOW (ref 80.0–100.0)
Monocytes Absolute: 0.7 10*3/uL (ref 0.2–0.9)
Monocytes Relative: 8 %
Neutro Abs: 5.2 10*3/uL (ref 1.4–6.5)
Neutrophils Relative %: 56 %
Platelets: 402 10*3/uL (ref 150–440)
RBC: 4.88 MIL/uL (ref 3.80–5.20)
RDW: 19.2 % — ABNORMAL HIGH (ref 11.5–14.5)
WBC: 9.1 10*3/uL (ref 3.6–11.0)

## 2016-06-21 LAB — IRON AND TIBC
Iron: 40 ug/dL (ref 28–170)
Saturation Ratios: 8 % — ABNORMAL LOW (ref 10.4–31.8)
TIBC: 489 ug/dL — ABNORMAL HIGH (ref 250–450)
UIBC: 449 ug/dL

## 2016-06-21 LAB — FERRITIN: Ferritin: 5 ng/mL — ABNORMAL LOW (ref 11–307)

## 2016-06-21 NOTE — Progress Notes (Signed)
Patient states she has been in [facility] hospital due to a bowel blockage.  She also has SOB and chest pain.  States she has no appetite and no energy.  States her complexion is gray.  Patient has gained 17# since December.  She did not want to know her weight today.

## 2016-06-21 NOTE — Progress Notes (Addendum)
[facility] day:  06/21/2016  Chief Complaint: Alison Maxwell is a 41 y.o. female with iron deficiency anemia and reactive thrombocytosis who is seen for 6 month assessment.  HPI:  The patient was last seen in the hematology clinic on 01/07/2016.  At that time, she had been seen by multiple specialists (psychiatry, dermatology, rheumatology, and hematology) in the past.  Review of systems was diffusely positive.  Rheumatology evaluation was underway.  Hematocrit was normal.  Iron stores were improving.  She missed several follow-up appointments.  She was admitted to [facility] from 04/22/2016 - 04/27/2016 with abdominal pain, nausea, vomiting, and constipation. CBC on 04/22/2016 revealed a hematocrit of 37.9, hemoglobin 12.1, MCV 83.0, platelets 494,000, and WBC 9,400.  CBC on 04/28/2016 revealed a hematocrit of 34.6, hemoglobin 10.8, MCV 82.3, platelets 412,000, and WBC 10,200.  Symptomatically, she notes no appetite. She states that "everything is sitting on top and vomiting". She is "still not well at all". She has no energy. Bowels are described as "stringy mucus". She has sicca syndrome. She is arranging follow-up with her primary care physician, Dr. Joan Flores.  She has been seen by Dr. Vicente Males, gastroenterology, in the past.   Past Medical History:  Diagnosis Date  . Anemia   . Arthritis    hands  . Headache   . MRSA (methicillin resistant Staphylococcus aureus)    pt unsure if currently is MRSA positive or not  . Postpartum depression   . Seizures ([place])    none in over 4 yrs    Past Surgical History:  Procedure Laterality Date  . CESAREAN SECTION    . CESAREAN SECTION    . COLONOSCOPY WITH PROPOFOL N/A 11/17/2015   Procedure: COLONOSCOPY WITH PROPOFOL;  Surgeon: Jonathon Bellows, MD;  Location: [place];  Service: Endoscopy;  Laterality: N/A;  INCOMPLETE PROCEDURE NO PREP  . ESOPHAGOGASTRODUODENOSCOPY (EGD) WITH  PROPOFOL N/A 11/17/2015   Procedure: ESOPHAGOGASTRODUODENOSCOPY (EGD) WITH PROPOFOL;  Surgeon: Jonathon Bellows, MD;  Location: [place];  Service: Endoscopy;  Laterality: N/A;  . KNEE SURGERY    . LIPOSUCTION    . TUBAL LIGATION      History reviewed. No pertinent family history.  Social History:  reports that she has never smoked. She has never used smokeless tobacco. She reports that she does not drink alcohol or use drugs.  She lives in [place] with her 2 children and mother.  The patient is alone today.  Allergies:  Allergies  Allergen Reactions  . Metoclopramide Nausea And Vomiting, Rash and Shortness Of Breath  . Levetiracetam Hives and Itching    Pt reports pulmonary edema  . Erythromycin Base Nausea And Vomiting and Rash       . Sulfa Antibiotics Nausea And Vomiting and Rash         Current Medications: Current Outpatient Prescriptions  Medication Sig Dispense Refill  . albuterol (PROVENTIL HFA;VENTOLIN HFA) 108 (90 Base) MCG/ACT inhaler Inhale into the lungs.    . Cholecalciferol (VITAMIN D3) 2000 units capsule Take 2,000 Units by mouth daily.    Marland Kitchen dicyclomine (BENTYL) 20 MG tablet Take 20 mg by mouth 2 (two) times daily.    . DULoxetine (CYMBALTA) 60 MG capsule Take 60 mg by mouth daily.    . hydrOXYzine (ATARAX/VISTARIL) 25 MG tablet TAKE 3 TABLETS (75 MG TOTAL) BY MOUTH 3 (THREE) TIMES DAILY AS NEEDED FOR ITCHING.    . nystatin ointment (MYCOSTATIN) APPLY THREE TIMES A  DAY TO CORNERS OF THE MOUTH AS NEEDED UNTIL HEALED  3  . pantoprazole (PROTONIX) 40 MG tablet Take 40 mg by mouth daily.    . pregabalin (LYRICA) 75 MG capsule Take 75 mg by mouth 2 (two) times daily.    Marland Kitchen Propylene Glycol-Glycerin 1-0.3 % SOLN Place 1 drop into both eyes daily.    Marland Kitchen acetaminophen (TYLENOL) 325 MG tablet Take by mouth.    . clindamycin (CLEOCIN T) 1 % lotion Apply topically.    Marland Kitchen ibuprofen (ADVIL,MOTRIN) 800 MG tablet Take by mouth.    . pilocarpine (SALAGEN) 5 MG tablet Take 5 mg  by mouth.     No current facility-administered medications for this visit.     Review of Systems:  GENERAL:  Feels "not well".  Sleeps poorly.  Weight up 17 pounds in 6 months. PERFORMANCE STATUS (ECOG):  1 HEENT:  Teeth breaking.  Dry eyes and mouth.  No visual changes, runny nose, sore throat, mouth sores or tenderness. Lungs: Shortness of breath.  Cough.  No hemoptysis. Cardiac:  Chest pain described as "stabbing pain".  No palpitations, orthopnea, or PND. GI:  Not eating well.  No diarrhea, melena or hematochezia. GU:  No urgency, frequency, dysuria, or hematuria.  No menses in > 6 months. Musculoskeletal:  Low back pain.  Joint flares.  No muscle tenderness. Extremities:  No pain or swelling. Skin:  Prurigo nodularis.  Skin flares.  Pulling sensation in skin.  Pruritic.  Areas on scalp not healing. Neuro:  General weakness.  Headache pressure.  No numbness or weakness, balance or coordination issues. Endocrine:  No diabetes, thyroid issues, or hot flashes.  Psych:  Anxiety. Insomnia. Pain:  Generalized pain (7 out of 10). Review of systems:  All other systems reviewed and found to be negative.  Physical Exam: Blood pressure 106/74, pulse 94, temperature 98.7 F (37.1 C), temperature source Tympanic, resp. rate 18, weight 207 lb 14.3 oz (94.3 kg). GENERAL:  Well developed, well nourished, woman sitting comfortably in the exam room in no acute distress. MENTAL STATUS:  Alert and oriented to person, place and time. HEAD:  Brown hair pulled up.  Normocephalic, atraumatic, face symmetric, no Cushingoid features. EYES:  Brown eyes.  Pupils equal round and reactive to light and accomodation.  No conjunctivitis or scleral icterus. ENT:  Oropharynx clear without lesion.  Tongue normal. Mucous membranes moist.  RESPIRATORY:  Clear to auscultation without rales, wheezes or rhonchi. CARDIOVASCULAR:  Regular rate and rhythm without murmur, rub or gallop. ABDOMEN:  Soft, non-tender, with  active bowel sounds, and no appreciable hepatosplenomegaly.  No masses. SKIN:  Scarring on upper extremities. EXTREMITIES: No edema, no skin discoloration or tenderness.  No palpable cords. LYMPH NODES: No palpable cervical, supraclavicular, axillary or inguinal adenopathy  NEUROLOGICAL: Unremarkable. PSYCH:  Anxious.    Appointment on 06/21/2016  Component Date Value Ref Range Status  . WBC 06/21/2016 9.1  3.6 - 11.0 K/uL Final  . RBC 06/21/2016 4.88  3.80 - 5.20 MIL/uL Final  . Hemoglobin 06/21/2016 12.0  12.0 - 16.0 g/dL Final  . HCT 06/21/2016 37.3  35.0 - 47.0 % Final  . MCV 06/21/2016 76.4* 80.0 - 100.0 fL Final  . MCH 06/21/2016 24.6* 26.0 - 34.0 pg Final  . MCHC 06/21/2016 32.3  32.0 - 36.0 g/dL Final  . RDW 06/21/2016 19.2* 11.5 - 14.5 % Final  . Platelets 06/21/2016 402  150 - 440 K/uL Final  . Neutrophils Relative % 06/21/2016 56  % Final  .  Neutro Abs 06/21/2016 5.2  1.4 - 6.5 K/uL Final  . Lymphocytes Relative 06/21/2016 32  % Final  . Lymphs Abs 06/21/2016 2.9  1.0 - 3.6 K/uL Final  . Monocytes Relative 06/21/2016 8  % Final  . Monocytes Absolute 06/21/2016 0.7  0.2 - 0.9 K/uL Final  . Eosinophils Relative 06/21/2016 3  % Final  . Eosinophils Absolute 06/21/2016 0.2  0 - 0.7 K/uL Final  . Basophils Relative 06/21/2016 1  % Final  . Basophils Absolute 06/21/2016 0.1  0 - 0.1 K/uL Final    Assessment:  Alison Maxwell is a 41 y.o. female with iron deficiency for at least 2 years.  Initial etiology was felt secondary to menorrhagia.  However, she has had no menses in > 6 months.  Diet is poor.  She denies any melena, hematochezia or hematuria.  Work-up on 11/11/2015 revealed a hematocrit of 35.2, hemoglobin 10.9, MCV 72.1, platelets 483,000, WBC 12,100 with an ANC of 6900.  Absolute monocyte count was 1100.  Ferritin was 5, iron saturation of 4% and TIBC of 527 (high) c/w iron deficiency.    Labs on 12/15/2015 revealed a hematocrit 37.5, hemoglobin 11.2, MCV 77.5,  platelets 367,000, white count 9300 with an [place] of 4600.  Ferritin was 33 (improved).  Iron studies included a saturation of 11% and a TIBC of 431.  B1 was 141.3 (normal).  Folate was 10.  Methylmalonic acid was 329 (normal) thus ruling out B12 deficiency.  Abdominal ultrasound on 12/15/2015 revealed increased liver echotexture suggesting hepatic steatosis. There were no focal lesions.  Spleen size was normal.  She received Venofer 200 mg IV on 11/11/2015 and 11/25/2015.  She states that "the infusions irritated everything".  She became "inflamed".  EGD on 11/17/2015 was normal.  There was a medium amount of food (residue) in the stomach.  Colonoscopy on 11/17/2015 revealed a poor prep.  Repeat colonoscopy was scheduled in 1 week (not yet performed).    Abdominal ultrasound on 12/15/2015 revealed increased liver echotexture suggesting hepatic steatosis. There were no focal lesions present.  Spleen size was normal.  She has been seen by multiple specialists (psychiatry, dermatology, rheumatology, and hematology) in the past.  Review of systems is diffusely positive.  Rheumatology evaluation is underway.  Hematocrit is normal.   Plan: 1.  Labs: CBC with diff, ferritin, iron studies. 2.  Discuss need for colonoscopy. 3.  RTC in 4 months for MD assess and labs (CBC with diff, ferritin, IgG +/- others).   Lequita Asal, MD  06/21/2016, 2:11 PM

## 2016-06-21 NOTE — Addendum Note (Signed)
Addended by: Mallie Snooks I on: 06/21/2016 02:52 PM   Modules accepted: Orders

## 2016-06-22 ENCOUNTER — Telehealth: Payer: Self-pay | Admitting: *Deleted

## 2016-06-22 NOTE — Telephone Encounter (Signed)
Called patient and LVM that her iron stores are low.  MD recommends GI consult.  Patient advised to call us back if she does not have GI doctor and we will make referral.  In the meantime she should eat iron rich foods.  Gave examples (red meats, green, leafy veggies).

## 2016-06-22 NOTE — Telephone Encounter (Signed)
-----   Message from Lequita Asal, MD sent at 06/21/2016  5:08 PM EDT ----- Regarding: Please call patient  Iron stores low.  Need to have GI evaluation.  Eat iron rich foods if possible.  M  ----- Message ----- From: Interface, Lab In [place] Sent: 06/21/2016   1:43 PM To: Lequita Asal, MD

## 2016-07-19 ENCOUNTER — Ambulatory Visit: Payer: Self-pay | Admitting: Hematology and Oncology

## 2016-08-31 ENCOUNTER — Other Ambulatory Visit: Payer: Self-pay | Admitting: *Deleted

## 2016-08-31 ENCOUNTER — Telehealth: Payer: Self-pay | Admitting: *Deleted

## 2016-08-31 DIAGNOSIS — D649 Anemia, unspecified: Secondary | ICD-10-CM

## 2016-08-31 NOTE — Telephone Encounter (Signed)
Patient called back to report that she also has had her first menstrual cycle in over a year 2 - 3 weeks ago and had to wear depends diapers and change them every hour for a bout a week and a half.

## 2016-08-31 NOTE — Telephone Encounter (Signed)
Patient called to report that she has an autoimmune disease and that all of her teeth has been pulled in 2 different surgeries due to infection in the past 2 and a half weeks. She has been unable to eat iron rich foods without teeth and she cannot take oral iron and the IV iron was causing a flair of her disease.  She reports that her lips are purple, skin is gray colored and she is bruising excessively and bleeding a lot when she gets a small cut, and has no energy. She feels she is anemic and is asking to come in for lab check and talk to doctor tomorrow and not wait until October for her scheduled appointment.

## 2016-08-31 NOTE — Telephone Encounter (Signed)
Tia states she spoke with the patient personally about appointment tomorrow

## 2016-08-31 NOTE — Telephone Encounter (Signed)
She will see her and check her labs with possible blood transfusion. I am calling tia for an appt

## 2016-09-01 ENCOUNTER — Inpatient Hospital Stay: Payer: Medicaid Other

## 2016-09-01 ENCOUNTER — Encounter: Payer: Self-pay | Admitting: Hematology and Oncology

## 2016-09-01 ENCOUNTER — Other Ambulatory Visit: Payer: Self-pay | Admitting: Hematology and Oncology

## 2016-09-01 ENCOUNTER — Inpatient Hospital Stay (HOSPITAL_BASED_OUTPATIENT_CLINIC_OR_DEPARTMENT_OTHER): Payer: Medicaid Other | Admitting: Hematology and Oncology

## 2016-09-01 ENCOUNTER — Other Ambulatory Visit: Payer: Self-pay | Admitting: *Deleted

## 2016-09-01 ENCOUNTER — Inpatient Hospital Stay: Payer: Medicaid Other | Attending: Hematology and Oncology

## 2016-09-01 VITALS — BP 120/84 | HR 98 | Temp 97.3°F | Resp 18 | Wt 201.6 lb

## 2016-09-01 DIAGNOSIS — M129 Arthropathy, unspecified: Secondary | ICD-10-CM

## 2016-09-01 DIAGNOSIS — Z8614 Personal history of Methicillin resistant Staphylococcus aureus infection: Secondary | ICD-10-CM

## 2016-09-01 DIAGNOSIS — K1379 Other lesions of oral mucosa: Secondary | ICD-10-CM | POA: Diagnosis not present

## 2016-09-01 DIAGNOSIS — F5089 Other specified eating disorder: Secondary | ICD-10-CM

## 2016-09-01 DIAGNOSIS — K117 Disturbances of salivary secretion: Secondary | ICD-10-CM | POA: Insufficient documentation

## 2016-09-01 DIAGNOSIS — D509 Iron deficiency anemia, unspecified: Secondary | ICD-10-CM

## 2016-09-01 DIAGNOSIS — Z79899 Other long term (current) drug therapy: Secondary | ICD-10-CM | POA: Insufficient documentation

## 2016-09-01 DIAGNOSIS — D649 Anemia, unspecified: Secondary | ICD-10-CM

## 2016-09-01 DIAGNOSIS — R531 Weakness: Secondary | ICD-10-CM | POA: Insufficient documentation

## 2016-09-01 DIAGNOSIS — R5383 Other fatigue: Secondary | ICD-10-CM | POA: Insufficient documentation

## 2016-09-01 DIAGNOSIS — Z8669 Personal history of other diseases of the nervous system and sense organs: Secondary | ICD-10-CM

## 2016-09-01 DIAGNOSIS — A499 Bacterial infection, unspecified: Secondary | ICD-10-CM

## 2016-09-01 DIAGNOSIS — N920 Excessive and frequent menstruation with regular cycle: Secondary | ICD-10-CM | POA: Diagnosis not present

## 2016-09-01 DIAGNOSIS — E538 Deficiency of other specified B group vitamins: Secondary | ICD-10-CM | POA: Insufficient documentation

## 2016-09-01 LAB — IRON AND TIBC
Iron: 62 ug/dL (ref 28–170)
Saturation Ratios: 12 % (ref 10.4–31.8)
TIBC: 504 ug/dL — ABNORMAL HIGH (ref 250–450)
UIBC: 442 ug/dL

## 2016-09-01 LAB — FOLATE: Folate: 12.6 ng/mL (ref >5.9)

## 2016-09-01 LAB — CBC WITH DIFFERENTIAL/PLATELET
Basophils Absolute: 0.1 10*3/uL (ref 0–0.1)
Basophils Relative: 1 %
Eosinophils Absolute: 0.3 10*3/uL (ref 0–0.7)
Eosinophils Relative: 3 %
HCT: 36.7 % (ref 35.0–47.0)
Hemoglobin: 12.2 g/dL (ref 12.0–16.0)
Lymphocytes Relative: 31 %
Lymphs Abs: 3.3 10*3/uL (ref 1.0–3.6)
MCH: 25.1 pg — ABNORMAL LOW (ref 26.0–34.0)
MCHC: 33.3 g/dL (ref 32.0–36.0)
MCV: 75.4 fL — ABNORMAL LOW (ref 80.0–100.0)
Monocytes Absolute: 0.9 10*3/uL (ref 0.2–0.9)
Monocytes Relative: 9 %
Neutro Abs: 6 10*3/uL (ref 1.4–6.5)
Neutrophils Relative %: 56 %
Platelets: 490 10*3/uL — ABNORMAL HIGH (ref 150–440)
RBC: 4.86 MIL/uL (ref 3.80–5.20)
RDW: 19.8 % — ABNORMAL HIGH (ref 11.5–14.5)
WBC: 10.7 10*3/uL (ref 3.6–11.0)

## 2016-09-01 LAB — SAMPLE TO BLOOD BANK

## 2016-09-01 LAB — FERRITIN: Ferritin: 11 ng/mL (ref 11–307)

## 2016-09-01 LAB — VITAMIN B12: Vitamin B-12: 197 pg/mL (ref 180–914)

## 2016-09-01 NOTE — Progress Notes (Signed)
Patient presents today with multiple complaints.  States she is having headaches where her pupils dilate.  She has had all of her teeth pulled due to infection.  States she is tired all the time.  She c/o pain in her head, right leg and upper right quadrant.  Patient has not had a period in 2-3 years.  Suddenly she had a period that required her to wear a depends that she had to change hourly for a week and a half.  States her ears are ringing.  She states her lips have been purple and her skin is gray.  She is concerned with not being able to eat and is concerned about her iron levels.  Further states she is having swelling in her lower right leg and foot.  Also c/o dizziness.

## 2016-09-01 NOTE — Progress Notes (Signed)
[facility] day:  09/01/2016  Chief Complaint: Alison Maxwell is a 41 y.o. female with iron deficiency anemia and reactive thrombocytosis who is seen early for 2 month assessment secondary to patient concerns.  HPI:  The patient was last seen in the hematology clinic on 06/21/2016.  At that time, she continued to have a multitude of symptoms.  It was recommended for her to follow-up with GI for a colonoscopy (poor prep noted in 11/2015).  Hematocrit was normal.  Iron stores were low.  She was not interested in additional IV iron as it caused her to be "inflamed".  Iron rich foods were suggested.  Colonoscopy at [facility] on 08/25/2016 was a poor prep.  There was solid stool in the rectum.  Symptomatically, patient does not have any energy. Patient reporting mouth pain and xerostomia. She has had all of her teeth extracted, which causes her to be unable to eat well.  She describes eating jello, pudding, soup, and Ensure.  Patient has not lost weight; weight gain of 17 pounds since last clinic weight. She is unable to tolerate oral iron supplementation. With IV iron, she reports GI symptom exacerbation. She is also reporting that her "bottom lip" has been blue this week and that her "skin has been turning grey". Patient has had ice pica for "awhile".  Patient has been having heavy menstrual bleeding.     Past Medical History:  Diagnosis Date  . Anemia   . Arthritis    hands  . Headache   . MRSA (methicillin resistant Staphylococcus aureus)    pt unsure if currently is MRSA positive or not  . Postpartum depression   . Seizures ([place])    none in over 4 yrs    Past Surgical History:  Procedure Laterality Date  . CESAREAN SECTION    . CESAREAN SECTION    . COLONOSCOPY WITH PROPOFOL N/A 11/17/2015   Procedure: COLONOSCOPY WITH PROPOFOL;  Surgeon: Jonathon Bellows, MD;  Location: [facility];  Service: Endoscopy;  Laterality: N/A;  INCOMPLETE PROCEDURE  NO PREP  . ESOPHAGOGASTRODUODENOSCOPY (EGD) WITH PROPOFOL N/A 11/17/2015   Procedure: ESOPHAGOGASTRODUODENOSCOPY (EGD) WITH PROPOFOL;  Surgeon: Jonathon Bellows, MD;  Location: [facility];  Service: Endoscopy;  Laterality: N/A;  . KNEE SURGERY    . LIPOSUCTION    . TUBAL LIGATION      History reviewed. No pertinent family history.  Social History:  reports that she has never smoked. She has never used smokeless tobacco. She reports that she does not drink alcohol or use drugs.  She lives in [place] with her 2 children and mother.  The patient is alone today.  Allergies:  Allergies  Allergen Reactions  . Metoclopramide Nausea And Vomiting, Rash and Shortness Of Breath  . Levetiracetam Hives and Itching    Pt reports pulmonary edema  . Erythromycin Base Nausea And Vomiting and Rash       . Sulfa Antibiotics Nausea And Vomiting and Rash         Current Medications: Current Outpatient Prescriptions  Medication Sig Dispense Refill  . albuterol (PROVENTIL HFA;VENTOLIN HFA) 108 (90 Base) MCG/ACT inhaler Inhale into the lungs.    . Cholecalciferol (VITAMIN D3) 2000 units capsule Take 2,000 Units by mouth daily.    . DULoxetine (CYMBALTA) 60 MG capsule Take 60 mg by mouth daily.    . hydrOXYzine (ATARAX/VISTARIL) 25 MG tablet TAKE 3 TABLETS (75 MG TOTAL) BY MOUTH 3 (THREE) TIMES DAILY  AS NEEDED FOR ITCHING.    . nystatin ointment (MYCOSTATIN) APPLY THREE TIMES A DAY TO CORNERS OF THE MOUTH AS NEEDED UNTIL HEALED  3  . pantoprazole (PROTONIX) 40 MG tablet Take 40 mg by mouth daily.    . pregabalin (LYRICA) 75 MG capsule Take 75 mg by mouth 2 (two) times daily.    Marland Kitchen Propylene Glycol-Glycerin 1-0.3 % SOLN Place 1 drop into both eyes daily.    Marland Kitchen acetaminophen (TYLENOL) 325 MG tablet Take by mouth.    . clindamycin (CLEOCIN T) 1 % lotion Apply topically.    . dicyclomine (BENTYL) 20 MG tablet Take 20 mg by mouth 2 (two) times daily.    Marland Kitchen ibuprofen (ADVIL,MOTRIN) 800 MG tablet Take by mouth.     . pilocarpine (SALAGEN) 5 MG tablet Take 5 mg by mouth.     No current facility-administered medications for this visit.     Review of Systems:  GENERAL:  No energy.  Fevers with flares.  Weight up 17 pounds since last visit. PERFORMANCE STATUS (ECOG):  1 HEENT:  Epistaxis.  Edenulous.  Dry eyes and mouth.  No visual changes, runny nose, sore throat, mouth sores or tenderness. Lungs: Shortness of breath.  Bad cough.  No hemoptysis. Cardiac:  Chest pain described as "stabbing pain".  No palpitations, orthopnea, or PND. GI:  Not eating well.  No diarrhea, melena or hematochezia. GU:  No urgency, frequency, dysuria, or hematuria.  No menses in > 6 months. Musculoskeletal:  Low back pain.  Joint flares.  No muscle tenderness. Extremities:  No pain or swelling. Skin:  Prurigo nodularis.  Skin flares.  Pulling sensation in skin.  Pruritic.  Areas on scalp not healing. Neuro:  General weakness.  Headache pressure.  Dizziness.  No numbness or weakness, balance or coordination issues. Endocrine:  No diabetes, thyroid issues, or hot flashes.  Psych:  Anxiety. Insomnia. Pain:  Pain in head, right leg, right upper quadrant. Review of systems:  All other systems reviewed and found to be negative.  Physical Exam: Blood pressure 120/84, pulse 98, temperature (!) 97.3 F (36.3 C), temperature source Tympanic, resp. rate 18, weight 201 lb 9 oz (91.4 kg). GENERAL:  Well developed, well nourished, woman sitting comfortably in the exam room in no acute distress. MENTAL STATUS:  Alert and oriented to person, place and time. HEAD:Brown hair pulled up. Normocephalic, atraumatic, face symmetric, no Cushingoid features. EYES:Browneyes. Pupils equal round and reactive to light and accomodation. No conjunctivitis or scleral icterus. ALP:[id number] clear without lesion. Tonguenormal. Mucous membranes moist.Edentulous. RESPIRATORY:Clear to auscultationwithout rales, wheezes or  rhonchi. CARDIOVASCULAR:Regular rate andrhythmwithout murmur, rub or gallop. ABDOMEN:Soft, non-tender, with active bowel sounds, and no appreciable hepatosplenomegaly. No masses. SKIN: Scarring on upper extremities.  Several open places. EXTREMITIES: No edema, no skin discoloration or tenderness. No palpable cords. LYMPHNODES: No palpable cervical, supraclavicular, axillary or inguinal adenopathy  NEUROLOGICAL: Unremarkable. PSYCH: Anxious. Tearful at times.   Appointment on 09/01/2016  Component Date Value Ref Range Status  . WBC 09/01/2016 10.7  3.6 - 11.0 K/uL Final  . RBC 09/01/2016 4.86  3.80 - 5.20 MIL/uL Final  . Hemoglobin 09/01/2016 12.2  12.0 - 16.0 g/dL Final  . HCT 09/01/2016 36.7  35.0 - 47.0 % Final  . MCV 09/01/2016 75.4* 80.0 - 100.0 fL Final  . MCH 09/01/2016 25.1* 26.0 - 34.0 pg Final  . MCHC 09/01/2016 33.3  32.0 - 36.0 g/dL Final  . RDW 09/01/2016 19.8* 11.5 - 14.5 % Final  .  Platelets 09/01/2016 490* 150 - 440 K/uL Final  . Neutrophils Relative % 09/01/2016 56  % Final  . Neutro Abs 09/01/2016 6.0  1.4 - 6.5 K/uL Final  . Lymphocytes Relative 09/01/2016 31  % Final  . Lymphs Abs 09/01/2016 3.3  1.0 - 3.6 K/uL Final  . Monocytes Relative 09/01/2016 9  % Final  . Monocytes Absolute 09/01/2016 0.9  0.2 - 0.9 K/uL Final  . Eosinophils Relative 09/01/2016 3  % Final  . Eosinophils Absolute 09/01/2016 0.3  0 - 0.7 K/uL Final  . Basophils Relative 09/01/2016 1  % Final  . Basophils Absolute 09/01/2016 0.1  0 - 0.1 K/uL Final  . Blood Bank Specimen 09/01/2016 SAMPLE AVAILABLE FOR TESTING   Final  . Sample Expiration 09/01/2016 09/04/2016   Final    Assessment:  Alison Maxwell is a 41 y.o. female with iron deficiency for at least 2 years.  Initial etiology was felt secondary to menorrhagia.  However, she has had no menses in > 6 months.  Diet is poor.  She denies any melena, hematochezia or hematuria.  Work-up on 11/11/2015 revealed a hematocrit of 35.2,  hemoglobin 10.9, MCV 72.1, platelets 483,000, WBC 12,100 with an ANC of 6900.  Absolute monocyte count was 1100.  B12 was 265 (low normal).  Ferritin was 5, iron saturation of 4% and TIBC of 527 (high) c/w iron deficiency.    Labs on 12/15/2015 revealed a hematocrit 37.5, hemoglobin 11.2, MCV 77.5, platelets 367,000, white count 9300 with an [place] of 4600.  Ferritin was 33 (improved).  Iron studies included a saturation of 11% and a TIBC of 431.  B1 was 141.3 (normal).  Folate was 10.  Methylmalonic acid was 329 (normal) thus ruling out B12 deficiency.  Abdominal ultrasound on 12/15/2015 revealed increased liver echotexture suggesting hepatic steatosis. There were no focal lesions.  Spleen size was normal.  She received Venofer 200 mg IV on 11/11/2015 and 11/25/2015.  She states that "the infusions irritated everything".  She became "inflamed". Ferritin goal is >100.   EGD on 11/17/2015 was normal.  There was a medium amount of food (residue) in the stomach.  Colonoscopy on 11/17/2015 and 08/25/2016 revealed a poor prep.  She is postponing her next colonoscopy until she has full dental implants.  Abdominal ultrasound on 12/15/2015 revealed increased liver echotexture suggesting hepatic steatosis. There were no focal lesions present.  Spleen size was normal.  She has been seen by multiple specialists (psychiatry, dermatology, rheumatology, and hematology).  Rheumatology evaluation is at [facility] notes possible Sjogren's (+ANA 1:80, ENA screen negative) and fibromyalgia.  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  Review of systems remains diffusely positive.   Symptomatically, she feels weak and tired.   She is eating only soft foods and liquids secondary to full dental extraction.  She has ice pica.  WBC 10.7 with and ANC of 6000. Hemoglobin 12.2, hematocrit 36.7, platelets 490,000.  Ferritin is 11.  Plan: 1.  Labs: CBC with diff,  ferritin, iron studies, B12, folate, IgG. 2.  Discuss interval colonoscopy- poor prep.  She is postponing her next colonoscopy until she has full dental implants. 3.  Preauth Venofer. 4.  She is willing to try iron infusions again despite previous "flare", GI symptoms.  Schedule Venofer weekly x 3. Ferritin goal >100.  5.  RTC in 2 weeks after IV iron for MD assessment, labs (CBC with diff, ferritin- day before), and +/- Venofer.  Addendum:  B12 is low normal.  Suspect B12 deficiency.  Check MMA.  Anticipate initiating B12 supplementation.   Melissa C. Mike Gip, MD  09/01/2016, 11:25 AM

## 2016-09-02 LAB — IGG: IgG (Immunoglobin G), Serum: 957 mg/dL (ref 700–1600)

## 2016-09-04 ENCOUNTER — Other Ambulatory Visit: Payer: Self-pay | Admitting: *Deleted

## 2016-09-04 DIAGNOSIS — E538 Deficiency of other specified B group vitamins: Secondary | ICD-10-CM

## 2016-09-04 DIAGNOSIS — D649 Anemia, unspecified: Secondary | ICD-10-CM

## 2016-09-05 ENCOUNTER — Other Ambulatory Visit: Payer: Self-pay | Admitting: *Deleted

## 2016-09-05 DIAGNOSIS — E538 Deficiency of other specified B group vitamins: Secondary | ICD-10-CM

## 2016-09-06 ENCOUNTER — Inpatient Hospital Stay: Payer: Medicaid Other

## 2016-09-06 VITALS — BP 129/90 | HR 96 | Temp 97.9°F | Resp 18

## 2016-09-06 DIAGNOSIS — D509 Iron deficiency anemia, unspecified: Secondary | ICD-10-CM | POA: Diagnosis not present

## 2016-09-06 DIAGNOSIS — D500 Iron deficiency anemia secondary to blood loss (chronic): Secondary | ICD-10-CM

## 2016-09-06 DIAGNOSIS — E538 Deficiency of other specified B group vitamins: Secondary | ICD-10-CM

## 2016-09-06 MED ORDER — SODIUM CHLORIDE 0.9 % IV SOLN
Freq: Once | INTRAVENOUS | Status: AC
  Administered 2016-09-06: 11:00:00 via INTRAVENOUS
  Filled 2016-09-06: qty 1000

## 2016-09-06 MED ORDER — IRON SUCROSE 20 MG/ML IV SOLN
200.0000 mg | Freq: Once | INTRAVENOUS | Status: AC
  Administered 2016-09-06: 200 mg via INTRAVENOUS
  Filled 2016-09-06: qty 10

## 2016-09-06 MED ORDER — SODIUM CHLORIDE 0.9 % IV SOLN
200.0000 mg | Freq: Once | INTRAVENOUS | Status: DC
Start: 2016-09-06 — End: 2016-09-06

## 2016-09-08 ENCOUNTER — Ambulatory Visit: Payer: Self-pay

## 2016-09-08 LAB — METHYLMALONIC ACID, SERUM: Methylmalonic Acid, Quantitative: 129 nmol/L (ref 0–378)

## 2016-09-12 ENCOUNTER — Inpatient Hospital Stay: Payer: Medicaid Other | Attending: Hematology and Oncology

## 2016-09-12 VITALS — BP 89/62 | HR 85 | Temp 97.2°F | Resp 18

## 2016-09-12 DIAGNOSIS — E538 Deficiency of other specified B group vitamins: Secondary | ICD-10-CM | POA: Diagnosis not present

## 2016-09-12 DIAGNOSIS — D500 Iron deficiency anemia secondary to blood loss (chronic): Secondary | ICD-10-CM | POA: Diagnosis not present

## 2016-09-12 DIAGNOSIS — Z79899 Other long term (current) drug therapy: Secondary | ICD-10-CM | POA: Diagnosis not present

## 2016-09-12 MED ORDER — IRON SUCROSE 20 MG/ML IV SOLN: 200.0000 mg | Freq: Once | INTRAVENOUS | Status: DC

## 2016-09-12 MED ORDER — SODIUM CHLORIDE 0.9 % IV SOLN
Freq: Once | INTRAVENOUS | Status: AC
  Administered 2016-09-12: 14:00:00 via INTRAVENOUS
  Filled 2016-09-12: qty 1000

## 2016-09-12 MED ORDER — IRON SUCROSE 20 MG/ML IV SOLN
200.0000 mg | Freq: Once | INTRAVENOUS | Status: AC
  Administered 2016-09-12: 200 mg via INTRAVENOUS
  Filled 2016-09-12: qty 10

## 2016-09-15 ENCOUNTER — Ambulatory Visit: Payer: Self-pay

## 2016-09-19 ENCOUNTER — Other Ambulatory Visit: Payer: Self-pay

## 2016-09-20 ENCOUNTER — Encounter: Payer: Self-pay | Admitting: Hematology and Oncology

## 2016-09-20 ENCOUNTER — Inpatient Hospital Stay: Payer: Medicaid Other

## 2016-09-20 ENCOUNTER — Ambulatory Visit: Payer: Self-pay | Admitting: Hematology and Oncology

## 2016-09-20 ENCOUNTER — Other Ambulatory Visit: Payer: Self-pay

## 2016-09-22 ENCOUNTER — Ambulatory Visit: Payer: Self-pay

## 2016-09-27 ENCOUNTER — Inpatient Hospital Stay: Payer: Medicaid Other

## 2016-09-27 VITALS — BP 130/80 | HR 72 | Temp 99.2°F | Resp 18

## 2016-09-27 DIAGNOSIS — D500 Iron deficiency anemia secondary to blood loss (chronic): Secondary | ICD-10-CM

## 2016-09-27 MED ORDER — IRON SUCROSE 20 MG/ML IV SOLN
INTRAVENOUS | Status: AC
Start: 2016-09-27 — End: 2016-09-27
  Filled 2016-09-27: qty 10

## 2016-09-27 MED ORDER — IRON SUCROSE 20 MG/ML IV SOLN
200.0000 mg | Freq: Once | INTRAVENOUS | Status: AC
  Administered 2016-09-27: 200 mg via INTRAVENOUS
  Filled 2016-09-27: qty 10

## 2016-09-27 MED ORDER — SODIUM CHLORIDE 0.9 % IV SOLN
Freq: Once | INTRAVENOUS | Status: AC
  Administered 2016-09-27: 15:00:00 via INTRAVENOUS
  Filled 2016-09-27: qty 1000

## 2016-10-03 ENCOUNTER — Inpatient Hospital Stay: Payer: Medicaid Other

## 2016-10-03 ENCOUNTER — Other Ambulatory Visit: Payer: Self-pay

## 2016-10-03 DIAGNOSIS — D649 Anemia, unspecified: Secondary | ICD-10-CM

## 2016-10-03 DIAGNOSIS — D500 Iron deficiency anemia secondary to blood loss (chronic): Secondary | ICD-10-CM | POA: Diagnosis not present

## 2016-10-03 DIAGNOSIS — E538 Deficiency of other specified B group vitamins: Secondary | ICD-10-CM

## 2016-10-03 LAB — CBC WITH DIFFERENTIAL/PLATELET
Basophils Absolute: 0.1 10*3/uL (ref 0–0.1)
Basophils Relative: 1 %
Eosinophils Absolute: 0.1 10*3/uL (ref 0–0.7)
Eosinophils Relative: 1 %
HCT: 38.6 % (ref 35.0–47.0)
Hemoglobin: 12.8 g/dL (ref 12.0–16.0)
Lymphocytes Relative: 24 %
Lymphs Abs: 2.6 10*3/uL (ref 1.0–3.6)
MCH: 26.4 pg (ref 26.0–34.0)
MCHC: 33.2 g/dL (ref 32.0–36.0)
MCV: 79.5 fL — ABNORMAL LOW (ref 80.0–100.0)
Monocytes Absolute: 0.8 10*3/uL (ref 0.2–0.9)
Monocytes Relative: 7 %
Neutro Abs: 7.2 10*3/uL — ABNORMAL HIGH (ref 1.4–6.5)
Neutrophils Relative %: 67 %
Platelets: 428 10*3/uL (ref 150–440)
RBC: 4.85 MIL/uL (ref 3.80–5.20)
RDW: 21.3 % — ABNORMAL HIGH (ref 11.5–14.5)
WBC: 10.6 10*3/uL (ref 3.6–11.0)

## 2016-10-03 LAB — FERRITIN: Ferritin: 147 ng/mL (ref 11–307)

## 2016-10-03 NOTE — Progress Notes (Signed)
[facility] day:  10/04/2016  Chief Complaint: Alison Maxwell is a 41 y.o. female with iron deficiency anemia and reactive thrombocytosis who is seen for a 1 month assessment.  HPI:  The patient was last seen in the hematology clinic on 09/01/2016.  At that time, she was fatigued. She complained of mouth pain and xerostomia.  She had recently had all of her teeth extracted. She was maintaining a soft diet.  She has not lost weight; she gained 17 pounds since her last clinic visit. She was having heavy menstrual bleeding. IgG was 957 (700 - 1600). CBC revealed hemoglobin 12.2, hematocrit 36.7, MCV 75.4, MCH 25.1, platelets 490,000. Iron saturation was 12% with a TIBC of 504. B12, folate, and MMA were normal.  Ferritin was 11 on 09/01/2016.  She reported significant fatigue and ice pica.  She was unable to tolerate oral iron supplementation due to GI side effects. She previously reported that IV iron replacement caused her to be "inflamed", however she was willing to try it again. She received Venofer 3 (09/06/2016, 09/12/2016, 09/27/2016).   Symptomatically, patient feels "better". Patient tolerated IV Venofer well. She has more energy. Ice pica has resolved. Patient continues to maintain a soft diet following a full dental extraction. No weight loss.    Past Medical History:  Diagnosis Date  . Anemia   . Arthritis    hands  . Headache   . MRSA (methicillin resistant Staphylococcus aureus)    pt unsure if currently is MRSA positive or not  . Postpartum depression   . Seizures ([place])    none in over 4 yrs    Past Surgical History:  Procedure Laterality Date  . CESAREAN SECTION    . CESAREAN SECTION    . COLONOSCOPY WITH PROPOFOL N/A 11/17/2015   Procedure: COLONOSCOPY WITH PROPOFOL;  Surgeon: Jonathon Bellows, MD;  Location: [facility];  Service: Endoscopy;  Laterality: N/A;  INCOMPLETE PROCEDURE NO PREP  . ESOPHAGOGASTRODUODENOSCOPY (EGD)  WITH PROPOFOL N/A 11/17/2015   Procedure: ESOPHAGOGASTRODUODENOSCOPY (EGD) WITH PROPOFOL;  Surgeon: Jonathon Bellows, MD;  Location: [facility];  Service: Endoscopy;  Laterality: N/A;  . KNEE SURGERY    . LIPOSUCTION    . TUBAL LIGATION      No family history on file.  Social History:  reports that she has never smoked. She has never used smokeless tobacco. She reports that she does not drink alcohol or use drugs.  She lives in [place] with her 2 children and mother.  The patient is alone today.  Allergies:  Allergies  Allergen Reactions  . Metoclopramide Nausea And Vomiting, Rash and Shortness Of Breath  . Levetiracetam Hives and Itching    Pt reports pulmonary edema  . Erythromycin Base Nausea And Vomiting and Rash       . Sulfa Antibiotics Nausea And Vomiting and Rash         Current Medications: Current Outpatient Prescriptions  Medication Sig Dispense Refill  . acetaminophen (TYLENOL) 325 MG tablet Take by mouth.    Marland Kitchen albuterol (PROVENTIL HFA;VENTOLIN HFA) 108 (90 Base) MCG/ACT inhaler Inhale into the lungs.    . Cholecalciferol (VITAMIN D3) 2000 units capsule Take 2,000 Units by mouth daily.    . clindamycin (CLEOCIN T) 1 % lotion Apply topically.    . dicyclomine (BENTYL) 20 MG tablet Take 20 mg by mouth 2 (two) times daily.    . DULoxetine (CYMBALTA) 60 MG capsule Take 60 mg by  mouth daily.    . hydrOXYzine (ATARAX/VISTARIL) 25 MG tablet TAKE 3 TABLETS (75 MG TOTAL) BY MOUTH 3 (THREE) TIMES DAILY AS NEEDED FOR ITCHING.    Marland Kitchen ibuprofen (ADVIL,MOTRIN) 800 MG tablet Take by mouth.    . nystatin ointment (MYCOSTATIN) APPLY THREE TIMES A DAY TO CORNERS OF THE MOUTH AS NEEDED UNTIL HEALED  3  . pantoprazole (PROTONIX) 40 MG tablet Take 40 mg by mouth daily.    . pilocarpine (SALAGEN) 5 MG tablet Take 5 mg by mouth.    . pregabalin (LYRICA) 75 MG capsule Take 75 mg by mouth 2 (two) times daily.    Marland Kitchen Propylene Glycol-Glycerin 1-0.3 % SOLN Place 1 drop into both eyes daily.      No current facility-administered medications for this visit.     Review of Systems:  GENERAL:  No energy.  Fevers with flares.  Weight down 1 pound. PERFORMANCE STATUS (ECOG):  1 HEENT:  Edenulous.  Dry eyes and mouth.  No visual changes, runny nose, sore throat, mouth sores or tenderness. Lungs: Shortness of breath.  Cough.  No hemoptysis. Cardiac:  Chest pain described as "stabbing pain".  No palpitations, orthopnea, or PND. GI:  Not eating well.  No diarrhea, melena or hematochezia. GU:  No urgency, frequency, dysuria, or hematuria.  No menses in > 6 months. Musculoskeletal:  Low back pain.  Joint flares.  No muscle tenderness. Extremities:  No pain or swelling. Skin:  Prurigo nodularis.  Skin flares.  Pulling sensation in skin.  Pruritic.  Areas on scalp not healing.  Hair loss. Neuro:  General weakness.  Headache pressure.  Dizziness.  No numbness or weakness, balance or coordination issues. Endocrine:  No diabetes, thyroid issues, or hot flashes.  Psych:  Anxiety. Insomnia. Pain:  Pain in head, right leg, right upper quadrant. Review of systems:  All other systems reviewed and found to be negative.  Physical Exam: Blood pressure 122/87, pulse 96, temperature 98.2 F (36.8 C), temperature source Tympanic, resp. rate 18, weight 200 lb (90.7 kg). GENERAL:  Well developed, well nourished, woman sitting comfortably in the exam room in no acute distress. MENTAL STATUS:  Alert and oriented to person, place and time. HEAD:Brown hair pulled up. Normocephalic, atraumatic, face symmetric, no Cushingoid features. EYES:Browneyes. Pupils equal round and reactive to light and accomodation. No conjunctivitis or scleral icterus. TDS:[id number] clear without lesion. Tonguenormal. Mucous membranes moist. Edentulous. RESPIRATORY:Clear to auscultationwithout rales, wheezes or rhonchi. CARDIOVASCULAR:Regular rate andrhythmwithout murmur, rub or gallop. ABDOMEN:Soft,  non-tender, with active bowel sounds, and no appreciable hepatosplenomegaly. No masses. SKIN: Scarring on upper extremities.  Several open places.  Make-up covering multiple facial sores. EXTREMITIES: No edema, no skin discoloration or tenderness. No palpable cords. LYMPHNODES: No palpable cervical, supraclavicular, axillary or inguinal adenopathy  NEUROLOGICAL: Unremarkable. PSYCH: Anxious.   Orders Only on 10/03/2016  Component Date Value Ref Range Status  . WBC 10/03/2016 10.6  3.6 - 11.0 K/uL Final  . RBC 10/03/2016 4.85  3.80 - 5.20 MIL/uL Final  . Hemoglobin 10/03/2016 12.8  12.0 - 16.0 g/dL Final  . HCT 10/03/2016 38.6  35.0 - 47.0 % Final  . MCV 10/03/2016 79.5* 80.0 - 100.0 fL Final  . MCH 10/03/2016 26.4  26.0 - 34.0 pg Final  . MCHC 10/03/2016 33.2  32.0 - 36.0 g/dL Final  . RDW 10/03/2016 21.3* 11.5 - 14.5 % Final  . Platelets 10/03/2016 428  150 - 440 K/uL Final  . Neutrophils Relative % 10/03/2016 67  % Final  .  Neutro Abs 10/03/2016 7.2* 1.4 - 6.5 K/uL Final  . Lymphocytes Relative 10/03/2016 24  % Final  . Lymphs Abs 10/03/2016 2.6  1.0 - 3.6 K/uL Final  . Monocytes Relative 10/03/2016 7  % Final  . Monocytes Absolute 10/03/2016 0.8  0.2 - 0.9 K/uL Final  . Eosinophils Relative 10/03/2016 1  % Final  . Eosinophils Absolute 10/03/2016 0.1  0 - 0.7 K/uL Final  . Basophils Relative 10/03/2016 1  % Final  . Basophils Absolute 10/03/2016 0.1  0 - 0.1 K/uL Final  . Ferritin 10/03/2016 147  11 - 307 ng/mL Final    Assessment:  Alison Maxwell is a 41 y.o. female with iron deficiency for at least 2 years.  Initial etiology was felt secondary to menorrhagia.  However, she has had no menses in > 6 months.  Diet is poor.  She denies any melena, hematochezia or hematuria.  Work-up on 11/11/2015 revealed a hematocrit of 35.2, hemoglobin 10.9, MCV 72.1, platelets 483,000, WBC 12,100 with an ANC of 6900.  Absolute monocyte count was 1100.  B12 was 265 (low normal).  Ferritin  was 5, iron saturation of 4% and TIBC of 527 (high) c/w iron deficiency.    Labs on 12/15/2015 revealed a hematocrit 37.5, hemoglobin 11.2, MCV 77.5, platelets 367,000, white count 9300 with an [place] of 4600.  Ferritin was 33 (improved).  Iron studies included a saturation of 11% and a TIBC of 431.  B1 was 141.3 (normal).  Folate was 10.  Methylmalonic acid was 329 (normal) thus ruling out B12 deficiency.  Abdominal ultrasound on 12/15/2015 revealed increased liver echotexture suggesting hepatic steatosis. There were no focal lesions.  Spleen size was normal.  She received Venofer 200 mg IV x 2 (11/11/2015 and 11/25/2015).  She received Venofer 3 (09/06/2016, 09/12/2016, 09/27/2016).  She states that "the infusions irritated everything".  She became "inflamed". Ferritin goal is >100.   Ferritin has been followed:  5 on 11/11/2015, 33 on 12/15/2015, 5 on 06/21/2016, 11 on 09/01/2016, and 147 on 10/03/2016.  EGD on 11/17/2015 was normal.  There was a medium amount of food (residue) in the stomach.  Colonoscopy on 11/17/2015 and 08/25/2016 revealed a poor prep.  She is postponing her next colonoscopy until she has full dental implants.  Abdominal ultrasound on 12/15/2015 revealed increased liver echotexture suggesting hepatic steatosis. There were no focal lesions present.  Spleen size was normal.  She has a history of B12 deficiency.  B12 was 265 on 11/11/2015 and 197 (180-914) on 09/01/2016.  MMA was normal on 12/15/2015 and 09/06/2016.  She has been seen by multiple specialists (psychiatry, dermatology, rheumatology, and hematology).  Rheumatology evaluation is at [facility] notes possible Sjogren's (+ANA 1:80, ENA screen negative) and fibromyalgia.  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  Review of systems remains diffusely positive.   Symptomatically, she feels "better".  She has more energy and her ice pica has resolved post IV  iron.  Exam is stable. WBC 10.6 with and ANC of 7200. Hemoglobin 12.8, hematocrit 38.6, platelets 428,000.  Ferritin is 147.  Plan: 1.  Review labs from 10/03/2016.  Hematocrit  was normal.  Ferritin is 147. 2.  Discuss interval colonoscopy- poor prep.  She is postponing her next colonoscopy until she has full dental implants. 3.  Discuss close monitoring of B12. Encouraged oral B12 supplementation.  4.  No Venofer today. Goal ferritin is 100. Patient is s/p Venofer x 3 and her level  is 147 today.  5.  Preauth B12 injections. 6.  RTC in 1 month for labs (CBC with diff, ferritin) 7.  RTC in 2 months for MD assessment, labs (CBC with diff, ferritin)  Addendum:  Patient requested monthly B12 injections.  First injection today.   Melissa C. Mike Gip, MD  10/04/2016, 11:51 AM

## 2016-10-04 ENCOUNTER — Inpatient Hospital Stay (HOSPITAL_BASED_OUTPATIENT_CLINIC_OR_DEPARTMENT_OTHER): Payer: Medicaid Other | Admitting: Hematology and Oncology

## 2016-10-04 ENCOUNTER — Inpatient Hospital Stay: Payer: Medicaid Other

## 2016-10-04 ENCOUNTER — Other Ambulatory Visit: Payer: Self-pay | Admitting: *Deleted

## 2016-10-04 VITALS — BP 122/87 | HR 96 | Temp 98.2°F | Resp 18 | Wt 200.0 lb

## 2016-10-04 DIAGNOSIS — Z79899 Other long term (current) drug therapy: Secondary | ICD-10-CM | POA: Diagnosis not present

## 2016-10-04 DIAGNOSIS — D500 Iron deficiency anemia secondary to blood loss (chronic): Secondary | ICD-10-CM

## 2016-10-04 DIAGNOSIS — E538 Deficiency of other specified B group vitamins: Secondary | ICD-10-CM | POA: Diagnosis not present

## 2016-10-04 DIAGNOSIS — D508 Other iron deficiency anemias: Secondary | ICD-10-CM

## 2016-10-04 DIAGNOSIS — D509 Iron deficiency anemia, unspecified: Secondary | ICD-10-CM | POA: Diagnosis not present

## 2016-10-04 MED ORDER — CYANOCOBALAMIN 1000 MCG/ML IJ SOLN
1000.0000 ug | Freq: Once | INTRAMUSCULAR | Status: AC
  Administered 2016-10-04: 1000 ug via INTRAMUSCULAR

## 2016-10-04 MED ORDER — CYANOCOBALAMIN 1000 MCG/ML IJ SOLN
INTRAMUSCULAR | Status: AC
  Filled 2016-10-04: qty 1

## 2016-10-04 NOTE — Progress Notes (Signed)
Patient states she is having problems sleeping. States since having her teeth pulled her skin and stomach issues have increased.  States her tongue is grayish purple.  She has no energy.  SOB is better.  Patient refused to weigh.  Weight entered today is a stated weight. O2 saturation 98%.

## 2016-10-04 NOTE — Patient Instructions (Signed)

## 2016-10-07 ENCOUNTER — Encounter: Payer: Self-pay | Admitting: Hematology and Oncology

## 2016-10-17 ENCOUNTER — Other Ambulatory Visit: Payer: Self-pay

## 2016-10-18 ENCOUNTER — Ambulatory Visit: Payer: Self-pay | Admitting: Hematology and Oncology

## 2016-10-18 ENCOUNTER — Ambulatory Visit: Payer: Self-pay

## 2016-10-18 ENCOUNTER — Other Ambulatory Visit: Payer: Self-pay

## 2016-10-30 ENCOUNTER — Inpatient Hospital Stay: Payer: Medicaid Other | Attending: Hematology and Oncology

## 2016-10-30 DIAGNOSIS — D500 Iron deficiency anemia secondary to blood loss (chronic): Secondary | ICD-10-CM | POA: Insufficient documentation

## 2016-10-30 DIAGNOSIS — D508 Other iron deficiency anemias: Secondary | ICD-10-CM

## 2016-10-30 LAB — CBC WITH DIFFERENTIAL/PLATELET
Basophils Absolute: 0.1 10*3/uL (ref 0–0.1)
Basophils Relative: 1 %
Eosinophils Absolute: 0.3 10*3/uL (ref 0–0.7)
Eosinophils Relative: 4 %
HCT: 40 % (ref 35.0–47.0)
Hemoglobin: 13.1 g/dL (ref 12.0–16.0)
Lymphocytes Relative: 40 %
Lymphs Abs: 3.3 10*3/uL (ref 1.0–3.6)
MCH: 26.9 pg (ref 26.0–34.0)
MCHC: 32.7 g/dL (ref 32.0–36.0)
MCV: 82.1 fL (ref 80.0–100.0)
Monocytes Absolute: 0.8 10*3/uL (ref 0.2–0.9)
Monocytes Relative: 10 %
Neutro Abs: 3.6 10*3/uL (ref 1.4–6.5)
Neutrophils Relative %: 45 %
Platelets: 419 10*3/uL (ref 150–440)
RBC: 4.87 MIL/uL (ref 3.80–5.20)
RDW: 19.8 % — ABNORMAL HIGH (ref 11.5–14.5)
WBC: 8.2 10*3/uL (ref 3.6–11.0)

## 2016-10-30 LAB — FERRITIN: Ferritin: 47 ng/mL (ref 11–307)

## 2016-11-01 ENCOUNTER — Inpatient Hospital Stay: Payer: Medicaid Other

## 2016-11-01 ENCOUNTER — Other Ambulatory Visit: Payer: Self-pay

## 2016-11-07 ENCOUNTER — Encounter: Payer: Self-pay | Admitting: Hematology and Oncology

## 2016-11-08 ENCOUNTER — Encounter: Payer: Self-pay | Admitting: Hematology and Oncology

## 2016-11-08 ENCOUNTER — Other Ambulatory Visit: Payer: Self-pay | Admitting: Urgent Care

## 2016-11-08 DIAGNOSIS — D649 Anemia, unspecified: Secondary | ICD-10-CM

## 2016-11-10 ENCOUNTER — Inpatient Hospital Stay: Payer: Medicaid Other | Attending: Hematology and Oncology

## 2016-11-10 ENCOUNTER — Inpatient Hospital Stay: Payer: Medicaid Other

## 2016-11-10 DIAGNOSIS — Z79899 Other long term (current) drug therapy: Secondary | ICD-10-CM | POA: Insufficient documentation

## 2016-11-10 DIAGNOSIS — N920 Excessive and frequent menstruation with regular cycle: Secondary | ICD-10-CM | POA: Insufficient documentation

## 2016-11-10 DIAGNOSIS — L281 Prurigo nodularis: Secondary | ICD-10-CM | POA: Insufficient documentation

## 2016-11-10 DIAGNOSIS — R5383 Other fatigue: Secondary | ICD-10-CM | POA: Insufficient documentation

## 2016-11-10 DIAGNOSIS — Z8669 Personal history of other diseases of the nervous system and sense organs: Secondary | ICD-10-CM | POA: Insufficient documentation

## 2016-11-10 DIAGNOSIS — D509 Iron deficiency anemia, unspecified: Secondary | ICD-10-CM | POA: Insufficient documentation

## 2016-11-10 DIAGNOSIS — D473 Essential (hemorrhagic) thrombocythemia: Secondary | ICD-10-CM | POA: Insufficient documentation

## 2016-11-10 DIAGNOSIS — M129 Arthropathy, unspecified: Secondary | ICD-10-CM | POA: Insufficient documentation

## 2016-11-10 DIAGNOSIS — R0602 Shortness of breath: Secondary | ICD-10-CM | POA: Insufficient documentation

## 2016-11-10 DIAGNOSIS — E538 Deficiency of other specified B group vitamins: Secondary | ICD-10-CM | POA: Insufficient documentation

## 2016-11-13 ENCOUNTER — Inpatient Hospital Stay: Payer: Medicaid Other

## 2016-11-13 DIAGNOSIS — R5383 Other fatigue: Secondary | ICD-10-CM | POA: Diagnosis not present

## 2016-11-13 DIAGNOSIS — L0109 Other impetigo: Secondary | ICD-10-CM | POA: Diagnosis not present

## 2016-11-13 DIAGNOSIS — L910 Hypertrophic scar: Secondary | ICD-10-CM | POA: Diagnosis not present

## 2016-11-13 DIAGNOSIS — L281 Prurigo nodularis: Secondary | ICD-10-CM | POA: Diagnosis not present

## 2016-11-13 DIAGNOSIS — M129 Arthropathy, unspecified: Secondary | ICD-10-CM | POA: Diagnosis not present

## 2016-11-13 DIAGNOSIS — N920 Excessive and frequent menstruation with regular cycle: Secondary | ICD-10-CM | POA: Diagnosis not present

## 2016-11-13 DIAGNOSIS — Z8669 Personal history of other diseases of the nervous system and sense organs: Secondary | ICD-10-CM | POA: Diagnosis not present

## 2016-11-13 DIAGNOSIS — D500 Iron deficiency anemia secondary to blood loss (chronic): Secondary | ICD-10-CM

## 2016-11-13 DIAGNOSIS — R0602 Shortness of breath: Secondary | ICD-10-CM | POA: Diagnosis not present

## 2016-11-13 DIAGNOSIS — D649 Anemia, unspecified: Secondary | ICD-10-CM

## 2016-11-13 DIAGNOSIS — D509 Iron deficiency anemia, unspecified: Secondary | ICD-10-CM | POA: Diagnosis not present

## 2016-11-13 DIAGNOSIS — D473 Essential (hemorrhagic) thrombocythemia: Secondary | ICD-10-CM | POA: Diagnosis not present

## 2016-11-13 DIAGNOSIS — D508 Other iron deficiency anemias: Secondary | ICD-10-CM

## 2016-11-13 DIAGNOSIS — Z79899 Other long term (current) drug therapy: Secondary | ICD-10-CM | POA: Diagnosis not present

## 2016-11-13 DIAGNOSIS — L638 Other alopecia areata: Secondary | ICD-10-CM | POA: Diagnosis not present

## 2016-11-13 DIAGNOSIS — E538 Deficiency of other specified B group vitamins: Secondary | ICD-10-CM | POA: Diagnosis not present

## 2016-11-13 LAB — CBC WITH DIFFERENTIAL/PLATELET
Basophils Absolute: 0.1 10*3/uL (ref 0–0.1)
Basophils Relative: 1 %
Eosinophils Absolute: 0.2 10*3/uL (ref 0–0.7)
Eosinophils Relative: 1 %
HCT: 40.9 % (ref 35.0–47.0)
Hemoglobin: 13.4 g/dL (ref 12.0–16.0)
Lymphocytes Relative: 34 %
Lymphs Abs: 4.2 10*3/uL — ABNORMAL HIGH (ref 1.0–3.6)
MCH: 27.2 pg (ref 26.0–34.0)
MCHC: 32.8 g/dL (ref 32.0–36.0)
MCV: 83 fL (ref 80.0–100.0)
Monocytes Absolute: 1 10*3/uL — ABNORMAL HIGH (ref 0.2–0.9)
Monocytes Relative: 8 %
Neutro Abs: 6.9 10*3/uL — ABNORMAL HIGH (ref 1.4–6.5)
Neutrophils Relative %: 56 %
Platelets: 421 10*3/uL (ref 150–440)
RBC: 4.92 MIL/uL (ref 3.80–5.20)
RDW: 18.9 % — ABNORMAL HIGH (ref 11.5–14.5)
WBC: 12.3 10*3/uL — ABNORMAL HIGH (ref 3.6–11.0)

## 2016-11-13 LAB — SEDIMENTATION RATE: Sed Rate: 10 mm/hr (ref 0–20)

## 2016-11-13 LAB — FERRITIN: Ferritin: 35 ng/mL (ref 11–307)

## 2016-11-13 MED ORDER — CYANOCOBALAMIN 1000 MCG/ML IJ SOLN
1000.0000 ug | Freq: Once | INTRAMUSCULAR | Status: AC
  Administered 2016-11-13: 1000 ug via INTRAMUSCULAR

## 2016-11-24 ENCOUNTER — Other Ambulatory Visit: Payer: Self-pay | Admitting: *Deleted

## 2016-11-24 DIAGNOSIS — D509 Iron deficiency anemia, unspecified: Secondary | ICD-10-CM

## 2016-11-28 ENCOUNTER — Inpatient Hospital Stay: Payer: Medicaid Other

## 2016-11-28 DIAGNOSIS — D509 Iron deficiency anemia, unspecified: Secondary | ICD-10-CM | POA: Diagnosis not present

## 2016-11-28 LAB — CBC WITH DIFFERENTIAL/PLATELET
Basophils Absolute: 0.1 10*3/uL (ref 0–0.1)
Basophils Relative: 1 %
Eosinophils Absolute: 0.1 10*3/uL (ref 0–0.7)
Eosinophils Relative: 1 %
HCT: 42.1 % (ref 35.0–47.0)
Hemoglobin: 14.2 g/dL (ref 12.0–16.0)
Lymphocytes Relative: 30 %
Lymphs Abs: 2.9 10*3/uL (ref 1.0–3.6)
MCH: 27.9 pg (ref 26.0–34.0)
MCHC: 33.6 g/dL (ref 32.0–36.0)
MCV: 82.9 fL (ref 80.0–100.0)
Monocytes Absolute: 0.5 10*3/uL (ref 0.2–0.9)
Monocytes Relative: 5 %
Neutro Abs: 6.2 10*3/uL (ref 1.4–6.5)
Neutrophils Relative %: 63 %
Platelets: 487 10*3/uL — ABNORMAL HIGH (ref 150–440)
RBC: 5.08 MIL/uL (ref 3.80–5.20)
RDW: 17.4 % — ABNORMAL HIGH (ref 11.5–14.5)
WBC: 9.8 10*3/uL (ref 3.6–11.0)

## 2016-11-28 LAB — FERRITIN: Ferritin: 42 ng/mL (ref 11–307)

## 2016-11-28 NOTE — Progress Notes (Signed)
[facility] day:  11/29/2016  Chief Complaint: Alison Maxwell is a 41 y.o. female with iron deficiency anemia and reactive thrombocytosis who is seen for a 2 month assessment.  HPI:  The patient was last seen in the hematology clinic on 10/04/2016.  At that time, she was feeling better. Patient had received Venofer 200mg  x 3 doses, which was well tolerated. Patient noted more energy. Ice pica had resolved. She continued a soft diet following a full dental extraction. CBC revealed hemoglobin of 12.8, hematocrit 38.6, MCV 79.5, and platelets 428,000. Ferritin was 147.  Labs on 10/30/2016 revealed a hemoglobin 13.1, hematocrit 40.0, MCV 82.1, and platelets 419,000. There was a significant drop in her ferritin from 147 down to 47.   Beginning on 11/08/2016 patient sent in several messages to the clinic via [facility]. Patient noted continued "skin infections" and blood "pooling under her skin". Patient advised that she had to relieve the pressure on one of the areas on her scalp because it was causing her to experience headaches. Patient was referred back to her PCP and/or dermatologist regarding these issues. She had questions regarding her anemia being a contributing factor to the symptoms she was experiencing at home. Patient noted that she was having skin discoloration, shortness of breath, and fatigue. She requested her erythropoietin level to be checked. Questions fielded, and patient education provided by nurse practitioner. Given that the patient had a normal hemoglobin and  Hematocrit, she was advised that there was no indication for a total protein testing at this time. Due to her symptoms, patient was advised to return to the clinic for additional lab work.  Labs on 11/13/2016 revealed a sedimentation rate of 10 (normal).  Ferritin had drifted down from 47 to 35.  Patient was subsequently scheduled for an additional Venofer 200 mg 1 dose, however she  did not come to the scheduled infusion appointment on 11/10/2016.  She received B12 injections on 10/04/2016 and 11/13/2016.  Labs on 11/28/2016 revealed hemoglobin of 14.2, hematocrit 42.1, MCV 82.9, and platelets 487,000. Ferritin was 42.  Symptomatically, he is doing "good".  Patient notes that she feels better when she receiving iron infusions regularly. Patient has been seen by her PCP for abnormal menses. She has been referred to GYN began she is concerned about possible endometriosis. Patient states, "I am having to wear depends, and I am filling them up". Patient is s/p her third dental surgery. She states, "They had to open up by gums on the top and the bottom because my gums were thin".   Patient has been referred to GI by her PCP. She notes that she has to proceed with her colonoscopy because it is "taking longer to get her teeth". Patient states, "I will be seeing a new one.  I had to let the other one go".   Past Medical History:  Diagnosis Date  . Anemia   . Arthritis    hands  . Headache   . MRSA (methicillin resistant Staphylococcus aureus)    pt unsure if currently is MRSA positive or not  . Postpartum depression   . Seizures ([place])    none in over 4 yrs    Past Surgical History:  Procedure Laterality Date  . CESAREAN SECTION    . CESAREAN SECTION    . COLONOSCOPY WITH PROPOFOL N/A 11/17/2015   Procedure: COLONOSCOPY WITH PROPOFOL;  Surgeon: Jonathon Bellows, MD;  Location: [facility];  Service: Endoscopy;  Laterality:  N/A;  INCOMPLETE PROCEDURE NO PREP  . ESOPHAGOGASTRODUODENOSCOPY (EGD) WITH PROPOFOL N/A 11/17/2015   Procedure: ESOPHAGOGASTRODUODENOSCOPY (EGD) WITH PROPOFOL;  Surgeon: Jonathon Bellows, MD;  Location: [facility];  Service: Endoscopy;  Laterality: N/A;  . KNEE SURGERY    . LIPOSUCTION    . TUBAL LIGATION      No family history on file.  Social History:  reports that  has never smoked. she has never used smokeless tobacco. She reports that  she does not drink alcohol or use drugs.  She lives in [place] with her 2 children and mother.  The patient is alone today.  Allergies:  Allergies  Allergen Reactions  . Metoclopramide Nausea And Vomiting, Rash and Shortness Of Breath  . Levetiracetam Hives and Itching    Pt reports pulmonary edema  . Erythromycin Base Nausea And Vomiting and Rash       . Sulfa Antibiotics Nausea And Vomiting and Rash         Current Medications: Current Outpatient Medications  Medication Sig Dispense Refill  . acetaminophen (TYLENOL) 325 MG tablet Take by mouth.    Marland Kitchen albuterol (PROVENTIL HFA;VENTOLIN HFA) 108 (90 Base) MCG/ACT inhaler Inhale into the lungs.    . Cholecalciferol (VITAMIN D3) 2000 units capsule Take 2,000 Units by mouth daily.    . clindamycin (CLEOCIN T) 1 % lotion Apply topically.    . DULoxetine (CYMBALTA) 60 MG capsule Take 60 mg by mouth daily.    . hydrOXYzine (ATARAX/VISTARIL) 25 MG tablet TAKE 3 TABLETS (75 MG TOTAL) BY MOUTH 3 (THREE) TIMES DAILY AS NEEDED FOR ITCHING.    Marland Kitchen ibuprofen (ADVIL,MOTRIN) 800 MG tablet Take by mouth.    . nystatin ointment (MYCOSTATIN) APPLY THREE TIMES A DAY TO CORNERS OF THE MOUTH AS NEEDED UNTIL HEALED  3  . pantoprazole (PROTONIX) 40 MG tablet Take 40 mg by mouth daily.    . pregabalin (LYRICA) 75 MG capsule Take 75 mg by mouth 2 (two) times daily.    Marland Kitchen Propylene Glycol-Glycerin 1-0.3 % SOLN Place 1 drop into both eyes daily.     No current facility-administered medications for this visit.     Review of Systems:  GENERAL:  Feels "ok" and "good".  Weight down 1 pound. PERFORMANCE STATUS (ECOG):  1 HEENT:  E[place]ulous.  Thin gums.  Dry eyes and mouth.  No visual changes, runny nose, sore throat, mouth sores or tenderness. Lungs: No shortness of breath.  Np cough.  No hemoptysis. Cardiac:  Chest pain.  No palpitations, orthopnea, or PND. GI:  Not eating well.  No diarrhea, melena or hematochezia. GU:  No urgency, frequency, dysuria, or  hematuria.  Heavy menstrual bleeding. Musculoskeletal:  Low back pain.  Joint flares.  No muscle tenderness. Extremities:  No pain or swelling. Skin:  Prurigo nodularis.  Skin flares.  Pulling sensation in skin.  Pruritic.  Areas on scalp not healing.  Hair loss. Neuro:  General weakness.  Headache.  No numbness or weakness, balance or coordination issues. Endocrine:  No diabetes, thyroid issues, or hot flashes.  Psych:  Anxiety. Insomnia. Pain: Headache and arm pain. Review of systems:  All other systems reviewed and found to be negative.  Physical Exam: Blood pressure 115/82, pulse 91, temperature 98 F (36.7 C), temperature source Tympanic, weight 199 lb 1.2 oz (90.3 kg). GENERAL:  Well developed, well nourished, woman sitting comfortably in the exam room in no acute distress. MENTAL STATUS:  Alert and oriented to person, place and time. HEAD:Brown hair  pulled up. Normocephalic, atraumatic, face symmetric, no Cushingoid features. EYES:[place]neyes. Pupils equal round and reactive to light and accomodation. No conjunctivitis or scleral icterus. INO:MVEHMCNOBS clear without lesion. Tonguenormal. Mucous membranes moist. Edentulous. RESPIRATORY:Clear to auscultationwithout rales, wheezes or rhonchi. CARDIOVASCULAR:Regular rate andrhythmwithout murmur, rub or gallop. ABDOMEN:Soft, non-tender, with active bowel sounds, and no appreciable hepatosplenomegaly. No masses. SKIN: Scarring on upper extremities.  Several open places.  Make-up covering multiple facial sores. EXTREMITIES: No edema, no skin discoloration or tenderness. No palpable cords. LYMPHNODES: No palpable cervical, supraclavicular, axillary or inguinal adenopathy  NEUROLOGICAL: Unremarkable. PSYCH: Anxious.   Appointment on 11/28/2016  Component Date Value Ref Range Status  . Ferritin 11/28/2016 42  11 - 307 ng/mL Final  . WBC 11/28/2016 9.8  3.6 - 11.0 K/uL Final  . RBC 11/28/2016 5.08  3.80 - 5.20  MIL/uL Final  . Hemoglobin 11/28/2016 14.2  12.0 - 16.0 g/dL Final  . HCT 11/28/2016 42.1  35.0 - 47.0 % Final  . MCV 11/28/2016 82.9  80.0 - 100.0 fL Final  . MCH 11/28/2016 27.9  26.0 - 34.0 pg Final  . MCHC 11/28/2016 33.6  32.0 - 36.0 g/dL Final  . RDW 11/28/2016 17.4* 11.5 - 14.5 % Final  . Platelets 11/28/2016 487* 150 - 440 K/uL Final  . Neutrophils Relative % 11/28/2016 63  % Final  . Neutro Abs 11/28/2016 6.2  1.4 - 6.5 K/uL Final  . Lymphocytes Relative 11/28/2016 30  % Final  . Lymphs Abs 11/28/2016 2.9  1.0 - 3.6 K/uL Final  . Monocytes Relative 11/28/2016 5  % Final  . Monocytes Absolute 11/28/2016 0.5  0.2 - 0.9 K/uL Final  . Eosinophils Relative 11/28/2016 1  % Final  . Eosinophils Absolute 11/28/2016 0.1  0 - 0.7 K/uL Final  . Basophils Relative 11/28/2016 1  % Final  . Basophils Absolute 11/28/2016 0.1  0 - 0.1 K/uL Final    Assessment:  Miriya Cloer is a 41 y.o. female with iron deficiency for at least 2 years.  Initial etiology was felt secondary to menorrhagia.  However, she has had no menses in > 6 months.  Diet is poor.  She denies any melena, hematochezia or hematuria.  Work-up on 11/11/2015 revealed a hematocrit of 35.2, hemoglobin 10.9, MCV 72.1, platelets 483,000, WBC 12,100 with an ANC of 6900.  Absolute monocyte count was 1100.  B12 was 265 (low normal).  Ferritin was 5, iron saturation of 4% and TIBC of 527 (high) c/w iron deficiency.    Labs on 12/15/2015 revealed a hematocrit 37.5, hemoglobin 11.2, MCV 77.5, platelets 367,000, white count 9300 with an [place] of 4600.  Ferritin was 33 (improved).  Iron studies included a saturation of 11% and a TIBC of 431.  B1 was 141.3 (normal).  Folate was 10.  Methylmalonic acid was 329 (normal) thus ruling out B12 deficiency.  Abdominal ultrasound on 12/15/2015 revealed increased liver echotexture suggesting hepatic steatosis. There were no focal lesions.  Spleen size was normal.  She received Venofer 200 mg IV x 2  (11/11/2015 and 11/25/2015).  She received Venofer 3 (09/06/2016, 09/12/2016, 09/27/2016).  She states that "the infusions irritated everything".  She became "inflamed". Ferritin goal is > 100.   Ferritin has been followed:  5 on 11/11/2015, 33 on 12/15/2015, 5 on 06/21/2016, 11 on 09/01/2016, 147 on 10/03/2016, 47 on 10/30/2016, 35 on 11/13/2016, and 42 on 11/28/2016.  EGD on 11/17/2015 was normal.  There was a medium amount of food (residue) in the stomach.  Colonoscopy on 11/17/2015 and 08/25/2016 revealed a poor prep.  She is postponing her next colonoscopy until she has full dental implants.  Abdominal ultrasound on 12/15/2015 revealed increased liver echotexture suggesting hepatic steatosis. There were no focal lesions present.  Spleen size was normal.  She has a history of B12 deficiency.  B12 was 265 on 11/11/2015 and 197 (180-914) on 09/01/2016.  MMA was normal on 12/15/2015 and 09/06/2016.  She began B12 monthly on 10/04/2016 (last 11/13/2016).  She has been seen by multiple specialists (psychiatry, dermatology, rheumatology, and hematology).  Rheumatology evaluation is at [facility] notes possible Sjogren's (+ANA 1:80, ENA screen negative) and fibromyalgia.  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  Review of systems remains diffusely positive.   Symptomatically, she feels "better".  She has more energy and her ice pica has resolved post IV iron.  Exam is stable.  Hemoglobin 14.2, hematocrit 42.1, and MCV 82.9.  Ferritin is 42.  Plan: 1.  Review labs from 11/28/2016.  CBC and MCV are normal.  Ferritin is 42. 2.  Continue monthly B12 injections (next due 12/13/2016). 3.  Follow up with GI as already scheduled for colonoscopy. 4.  Follow up with GYN for evaluation of abnormal uterine bleeding and possible endometriosis.  5.  RTC in 6 weeks for labs (CBC with diff, CMP, ferritin, anti-parietal antibody, intrinsic factor  antibody). 6.  RTC in 3 months for MD assessment, labs (CBC with diff, ferritin), and +/- Venofer.   Honor Loh, NP 11/29/2016, 12:04 PM   I saw and evaluated the patient, participating in the key portions of the service and reviewing pertinent diagnostic studies and records.  I reviewed the nurse practitioner's note and agree with the findings and the plan.  The assessment and plan were discussed with the patient. Several questions were asked by the patient and answered.   Nolon Stalls, MD 11/29/2016,12:04 PM

## 2016-11-29 ENCOUNTER — Ambulatory Visit: Payer: Self-pay | Admitting: Hematology and Oncology

## 2016-11-29 ENCOUNTER — Ambulatory Visit: Payer: Self-pay

## 2016-11-29 ENCOUNTER — Other Ambulatory Visit: Payer: Self-pay

## 2016-11-29 ENCOUNTER — Other Ambulatory Visit: Payer: Self-pay | Admitting: Hematology and Oncology

## 2016-11-29 ENCOUNTER — Inpatient Hospital Stay (HOSPITAL_BASED_OUTPATIENT_CLINIC_OR_DEPARTMENT_OTHER): Payer: Medicaid Other | Admitting: Hematology and Oncology

## 2016-11-29 ENCOUNTER — Other Ambulatory Visit: Payer: Self-pay | Admitting: *Deleted

## 2016-11-29 ENCOUNTER — Inpatient Hospital Stay: Payer: Medicaid Other

## 2016-11-29 VITALS — BP 115/82 | HR 91 | Temp 98.0°F | Wt 199.1 lb

## 2016-11-29 DIAGNOSIS — D473 Essential (hemorrhagic) thrombocythemia: Secondary | ICD-10-CM

## 2016-11-29 DIAGNOSIS — D509 Iron deficiency anemia, unspecified: Secondary | ICD-10-CM

## 2016-11-29 DIAGNOSIS — Z79899 Other long term (current) drug therapy: Secondary | ICD-10-CM | POA: Diagnosis not present

## 2016-11-29 DIAGNOSIS — M129 Arthropathy, unspecified: Secondary | ICD-10-CM | POA: Diagnosis not present

## 2016-11-29 DIAGNOSIS — N920 Excessive and frequent menstruation with regular cycle: Secondary | ICD-10-CM

## 2016-11-29 DIAGNOSIS — E538 Deficiency of other specified B group vitamins: Secondary | ICD-10-CM

## 2016-11-29 DIAGNOSIS — Z8669 Personal history of other diseases of the nervous system and sense organs: Secondary | ICD-10-CM

## 2016-11-29 DIAGNOSIS — R0602 Shortness of breath: Secondary | ICD-10-CM | POA: Diagnosis not present

## 2016-11-29 DIAGNOSIS — L281 Prurigo nodularis: Secondary | ICD-10-CM | POA: Diagnosis not present

## 2016-11-29 DIAGNOSIS — R5383 Other fatigue: Secondary | ICD-10-CM | POA: Diagnosis not present

## 2016-11-29 NOTE — Progress Notes (Signed)
Patient states she has had pain in left side (arm and upper chest area) for the past week.  She often feels like this when her iron gets low.   States she sweats a lot and is out of breath.   States she has no energy to do anything.  Sleeping a lot.  No appetite.  Constipation problems.

## 2016-11-30 ENCOUNTER — Encounter: Payer: Self-pay | Admitting: Hematology and Oncology

## 2016-12-13 ENCOUNTER — Inpatient Hospital Stay: Payer: Medicaid Other

## 2016-12-14 ENCOUNTER — Ambulatory Visit: Payer: Medicaid Other

## 2017-01-10 ENCOUNTER — Inpatient Hospital Stay: Payer: Medicaid Other

## 2017-01-11 ENCOUNTER — Inpatient Hospital Stay: Payer: Medicaid Other | Attending: Hematology and Oncology

## 2017-01-11 ENCOUNTER — Inpatient Hospital Stay: Payer: Medicaid Other

## 2017-01-11 VITALS — BP 104/74 | HR 91 | Temp 96.4°F | Resp 18

## 2017-01-11 DIAGNOSIS — E538 Deficiency of other specified B group vitamins: Secondary | ICD-10-CM | POA: Diagnosis not present

## 2017-01-11 DIAGNOSIS — D509 Iron deficiency anemia, unspecified: Secondary | ICD-10-CM | POA: Insufficient documentation

## 2017-01-11 DIAGNOSIS — D500 Iron deficiency anemia secondary to blood loss (chronic): Secondary | ICD-10-CM

## 2017-01-11 DIAGNOSIS — Z79899 Other long term (current) drug therapy: Secondary | ICD-10-CM | POA: Diagnosis not present

## 2017-01-11 LAB — COMPREHENSIVE METABOLIC PANEL
ALT: 15 U/L (ref 14–54)
AST: 25 U/L (ref 15–41)
Albumin: 4.1 g/dL (ref 3.5–5.0)
Alkaline Phosphatase: 94 U/L (ref 38–126)
Anion gap: 9 (ref 5–15)
BUN: 12 mg/dL (ref 6–20)
CO2: 23 mmol/L (ref 22–32)
Calcium: 9.1 mg/dL (ref 8.9–10.3)
Chloride: 104 mmol/L (ref 101–111)
Creatinine, Ser: 0.61 mg/dL (ref 0.44–1.00)
GFR calc Af Amer: >60 mL/min (ref >60)
GFR calc non Af Amer: >60 mL/min (ref >60)
Glucose, Bld: 100 mg/dL — ABNORMAL HIGH (ref 65–99)
Potassium: 3.8 mmol/L (ref 3.5–5.1)
Sodium: 136 mmol/L (ref 135–145)
Total Bilirubin: 0.4 mg/dL (ref 0.3–1.2)
Total Protein: 7.7 g/dL (ref 6.5–8.1)

## 2017-01-11 LAB — CBC WITH DIFFERENTIAL/PLATELET
Basophils Absolute: 0.1 10*3/uL (ref 0–0.1)
Basophils Relative: 1 %
Eosinophils Absolute: 0.2 10*3/uL (ref 0–0.7)
Eosinophils Relative: 3 %
HCT: 37.7 % (ref 35.0–47.0)
Hemoglobin: 12.8 g/dL (ref 12.0–16.0)
Lymphocytes Relative: 32 %
Lymphs Abs: 2.5 10*3/uL (ref 1.0–3.6)
MCH: 28.4 pg (ref 26.0–34.0)
MCHC: 34.1 g/dL (ref 32.0–36.0)
MCV: 83.3 fL (ref 80.0–100.0)
Monocytes Absolute: 0.8 10*3/uL (ref 0.2–0.9)
Monocytes Relative: 10 %
Neutro Abs: 4.2 10*3/uL (ref 1.4–6.5)
Neutrophils Relative %: 54 %
Platelets: 366 10*3/uL (ref 150–440)
RBC: 4.52 MIL/uL (ref 3.80–5.20)
RDW: 15 % — ABNORMAL HIGH (ref 11.5–14.5)
WBC: 7.8 10*3/uL (ref 3.6–11.0)

## 2017-01-11 LAB — FERRITIN: Ferritin: 25 ng/mL (ref 11–307)

## 2017-01-11 MED ORDER — CYANOCOBALAMIN 1000 MCG/ML IJ SOLN
1000.0000 ug | Freq: Once | INTRAMUSCULAR | Status: AC
  Administered 2017-01-11: 1000 ug via INTRAMUSCULAR
  Filled 2017-01-11: qty 1

## 2017-01-12 ENCOUNTER — Telehealth: Payer: Self-pay | Admitting: *Deleted

## 2017-01-12 LAB — INTRINSIC FACTOR ANTIBODIES: Intrinsic Factor: 8.5 AU/mL — ABNORMAL HIGH (ref 0.0–1.1)

## 2017-01-12 LAB — ANTI-PARIETAL ANTIBODY: Parietal Cell Antibody-IgG: 1.4 Units (ref 0.0–20.0)

## 2017-01-12 NOTE — Telephone Encounter (Signed)
-----   Message from Lequita Asal, MD sent at 01/12/2017  2:55 AM EST ----- Regarding: Please call patient  Ferritin < 30.  Consider Venofer weekly x 2 if symptomatic.  M  ----- Message ----- From: Interface, Lab In [place] Sent: 01/11/2017   2:56 PM To: Lequita Asal, MD

## 2017-01-12 NOTE — Telephone Encounter (Signed)
Patient scheduled for venofer X 2.

## 2017-01-12 NOTE — Telephone Encounter (Signed)
-----   Message from Lequita Asal, MD sent at 01/12/2017  2:55 AM EST ----- Regarding: Please call patient  Ferritin < 30.  Consider Venofer weekly x 2 if symptomatic.  M  ----- Message ----- From: Interface, Lab In [place] Sent: 01/11/2017   2:56 PM To: Lequita Asal, MD

## 2017-01-12 NOTE — Telephone Encounter (Signed)
Attempted to call patient.  No answer and no answering machine.  Will try again on Monday.

## 2017-01-15 ENCOUNTER — Encounter: Payer: Self-pay | Admitting: Hematology and Oncology

## 2017-01-16 ENCOUNTER — Inpatient Hospital Stay: Payer: Medicaid Other

## 2017-01-17 ENCOUNTER — Inpatient Hospital Stay: Payer: Medicaid Other

## 2017-01-17 VITALS — BP 106/70 | HR 87 | Temp 97.0°F | Resp 18

## 2017-01-17 DIAGNOSIS — D509 Iron deficiency anemia, unspecified: Secondary | ICD-10-CM | POA: Diagnosis not present

## 2017-01-17 DIAGNOSIS — D500 Iron deficiency anemia secondary to blood loss (chronic): Secondary | ICD-10-CM

## 2017-01-17 MED ORDER — SODIUM CHLORIDE 0.9 % IV SOLN
Freq: Once | INTRAVENOUS | Status: AC
  Administered 2017-01-17: 11:00:00 via INTRAVENOUS
  Filled 2017-01-17: qty 1000

## 2017-01-17 MED ORDER — IRON SUCROSE 20 MG/ML IV SOLN
200.0000 mg | Freq: Once | INTRAVENOUS | Status: AC
  Administered 2017-01-17: 200 mg via INTRAVENOUS
  Filled 2017-01-17: qty 10

## 2017-01-17 MED ORDER — IRON SUCROSE 20 MG/ML IV SOLN
INTRAVENOUS | Status: AC
  Filled 2017-01-17: qty 10

## 2017-01-22 ENCOUNTER — Inpatient Hospital Stay: Payer: Medicaid Other

## 2017-01-22 VITALS — BP 124/82 | HR 108 | Temp 96.5°F | Resp 20

## 2017-01-22 DIAGNOSIS — D509 Iron deficiency anemia, unspecified: Secondary | ICD-10-CM | POA: Diagnosis not present

## 2017-01-22 DIAGNOSIS — D500 Iron deficiency anemia secondary to blood loss (chronic): Secondary | ICD-10-CM

## 2017-01-22 MED ORDER — SODIUM CHLORIDE 0.9 % IV SOLN
Freq: Once | INTRAVENOUS | Status: AC
  Administered 2017-01-22: 15:00:00 via INTRAVENOUS
  Filled 2017-01-22: qty 1000

## 2017-01-22 MED ORDER — IRON SUCROSE 20 MG/ML IV SOLN
INTRAVENOUS | Status: AC
  Filled 2017-01-22: qty 10

## 2017-01-22 MED ORDER — IRON SUCROSE 20 MG/ML IV SOLN
200.0000 mg | Freq: Once | INTRAVENOUS | Status: AC
  Administered 2017-01-22: 200 mg via INTRAVENOUS
  Filled 2017-01-22: qty 10

## 2017-01-22 NOTE — Patient Instructions (Signed)

## 2017-01-22 NOTE — Progress Notes (Signed)
Patient's IV initiated on second stick in right wrist but IV was removed at patient's request because her wrist "hurt".  IV was attempted three more times and successful on third try in right antecubital.

## 2017-02-07 ENCOUNTER — Inpatient Hospital Stay: Payer: Medicaid Other

## 2017-02-08 ENCOUNTER — Encounter: Payer: Self-pay | Admitting: Hematology and Oncology

## 2017-02-19 ENCOUNTER — Inpatient Hospital Stay: Payer: Medicaid Other

## 2017-02-19 ENCOUNTER — Inpatient Hospital Stay: Payer: Medicaid Other | Attending: Oncology

## 2017-02-19 VITALS — BP 123/76 | HR 109 | Temp 98.0°F | Resp 20

## 2017-02-19 DIAGNOSIS — E538 Deficiency of other specified B group vitamins: Secondary | ICD-10-CM | POA: Diagnosis present

## 2017-02-19 DIAGNOSIS — D509 Iron deficiency anemia, unspecified: Secondary | ICD-10-CM | POA: Diagnosis present

## 2017-02-19 DIAGNOSIS — D500 Iron deficiency anemia secondary to blood loss (chronic): Secondary | ICD-10-CM

## 2017-02-19 LAB — CBC WITH DIFFERENTIAL/PLATELET
Basophils Absolute: 0.1 10*3/uL (ref 0–0.1)
Basophils Relative: 1 %
Eosinophils Absolute: 0.2 10*3/uL (ref 0–0.7)
Eosinophils Relative: 3 %
HCT: 41.6 % (ref 35.0–47.0)
Hemoglobin: 14.2 g/dL (ref 12.0–16.0)
Lymphocytes Relative: 31 %
Lymphs Abs: 3.1 10*3/uL (ref 1.0–3.6)
MCH: 28.9 pg (ref 26.0–34.0)
MCHC: 34.1 g/dL (ref 32.0–36.0)
MCV: 84.7 fL (ref 80.0–100.0)
Monocytes Absolute: 0.9 10*3/uL (ref 0.2–0.9)
Monocytes Relative: 9 %
Neutro Abs: 5.6 10*3/uL (ref 1.4–6.5)
Neutrophils Relative %: 56 %
Platelets: 420 10*3/uL (ref 150–440)
RBC: 4.91 MIL/uL (ref 3.80–5.20)
RDW: 14.8 % — ABNORMAL HIGH (ref 11.5–14.5)
WBC: 9.8 10*3/uL (ref 3.6–11.0)

## 2017-02-19 LAB — FERRITIN: Ferritin: 121 ng/mL (ref 11–307)

## 2017-02-19 MED ORDER — CYANOCOBALAMIN 1000 MCG/ML IJ SOLN
1000.0000 ug | Freq: Once | INTRAMUSCULAR | Status: AC
  Administered 2017-02-19: 1000 ug via INTRAMUSCULAR

## 2017-02-19 MED ORDER — CYANOCOBALAMIN 1000 MCG/ML IJ SOLN
INTRAMUSCULAR | Status: AC
  Filled 2017-02-19: qty 1

## 2017-02-19 NOTE — Progress Notes (Signed)
Patient here today for B12 injection. She states she knows her labs are crashing again. " I am gray all over. I am weak. I have terrible chest pain, I am sure my Left lung is collapsed again". VSS. No dyspnea noted in clinic. Patient has appt to see Dr Mike Gip on [place] this week.

## 2017-02-21 ENCOUNTER — Inpatient Hospital Stay: Payer: Medicaid Other

## 2017-02-21 ENCOUNTER — Encounter: Payer: Self-pay | Admitting: Hematology and Oncology

## 2017-02-21 ENCOUNTER — Inpatient Hospital Stay (HOSPITAL_BASED_OUTPATIENT_CLINIC_OR_DEPARTMENT_OTHER): Payer: Medicaid Other | Admitting: Hematology and Oncology

## 2017-02-21 VITALS — BP 117/84 | HR 99 | Temp 98.3°F | Resp 20 | Wt 191.1 lb

## 2017-02-21 DIAGNOSIS — R5382 Chronic fatigue, unspecified: Secondary | ICD-10-CM | POA: Diagnosis not present

## 2017-02-21 DIAGNOSIS — E538 Deficiency of other specified B group vitamins: Secondary | ICD-10-CM | POA: Diagnosis not present

## 2017-02-21 DIAGNOSIS — D510 Vitamin B12 deficiency anemia due to intrinsic factor deficiency: Secondary | ICD-10-CM | POA: Insufficient documentation

## 2017-02-21 DIAGNOSIS — D509 Iron deficiency anemia, unspecified: Secondary | ICD-10-CM

## 2017-02-21 NOTE — Progress Notes (Signed)
[facility] day:  02/21/2017  Chief Complaint: Alison Maxwell is a 42 y.o. female with iron deficiency anemia and reactive thrombocytosis who is seen for 3 month assessment.  HPI:  The patient was last seen in the hematology clinic on 11/29/2016.  At that time, she felt "better".  Her ice pica had resolved post IV iron.  Exam was stable.  Hemoglobin was 14.2, hematocrit 42.1, and MCV 82.9.  Ferritin was 42.  She has received monthly B12 injections (11/23/2016, 01/11/2017, and 02/19/2017).    CBC on 01/11/2017 revealed a hematocrit of 37.7, hemoglobin 12.8, and MCV 83.3.  Ferritin was 25.  She received Venofer on 01/17/2017.  She had IV access issues on 01/22/2017 and was unable to complete her Venofer.  During the interim, patient continues to have chronic fatigue. Patient is experiencing hypersomnia symptoms. She confirms that she feels rested after sleeping. Patient denies depression. Patient has undergone a full dental extraction. She has had 3 surgeries since 08/2016. She is still having issues with her gums, which has forced her to maintain a liquid diet. Patient has lost 8 pounds since 11/2016. Patient plans to have a "full set of dental implants placed". She continues to defer GI and GYN evaluations until "after her teeth get taken care of".   She denies bleeding; no hematochezia, melena, or gross hematuria. She denies pain in the clinic today.    Past Medical History:  Diagnosis Date  . Anemia   . Arthritis    hands  . Headache   . MRSA (methicillin resistant Staphylococcus aureus)    pt unsure if currently is MRSA positive or not  . Postpartum depression   . Seizures ([place])    none in over 4 yrs    Past Surgical History:  Procedure Laterality Date  . CESAREAN SECTION    . CESAREAN SECTION    . COLONOSCOPY WITH PROPOFOL N/A 11/17/2015   Procedure: COLONOSCOPY WITH PROPOFOL;  Surgeon: Jonathon Bellows, MD;  Location: [facility];   Service: Endoscopy;  Laterality: N/A;  INCOMPLETE PROCEDURE NO PREP  . ESOPHAGOGASTRODUODENOSCOPY (EGD) WITH PROPOFOL N/A 11/17/2015   Procedure: ESOPHAGOGASTRODUODENOSCOPY (EGD) WITH PROPOFOL;  Surgeon: Jonathon Bellows, MD;  Location: [facility];  Service: Endoscopy;  Laterality: N/A;  . KNEE SURGERY    . LIPOSUCTION    . TUBAL LIGATION      History reviewed. No pertinent family history.  Social History:  reports that  has never smoked. she has never used smokeless tobacco. She reports that she does not drink alcohol or use drugs.  She lives in [place] with her 2 children and mother.  The patient is alone today.  Allergies:  Allergies  Allergen Reactions  . Metoclopramide Nausea And Vomiting, Rash and Shortness Of Breath  . Levetiracetam Hives and Itching    Pt reports pulmonary edema  . Erythromycin Base Nausea And Vomiting and Rash       . Sulfa Antibiotics Nausea And Vomiting and Rash         Current Medications: Current Outpatient Medications  Medication Sig Dispense Refill  . acetaminophen (TYLENOL) 325 MG tablet Take by mouth.    Marland Kitchen albuterol (PROVENTIL HFA;VENTOLIN HFA) 108 (90 Base) MCG/ACT inhaler Inhale into the lungs.    . Cholecalciferol (VITAMIN D3) 2000 units capsule Take 2,000 Units by mouth daily.    . clindamycin (CLEOCIN T) 1 % lotion Apply topically.    . DULoxetine (CYMBALTA) 60 MG capsule Take  60 mg by mouth daily.    . hydrOXYzine (ATARAX/VISTARIL) 25 MG tablet TAKE 3 TABLETS (75 MG TOTAL) BY MOUTH 3 (THREE) TIMES DAILY AS NEEDED FOR ITCHING.    Marland Kitchen ibuprofen (ADVIL,MOTRIN) 800 MG tablet Take by mouth.    . nystatin ointment (MYCOSTATIN) APPLY THREE TIMES A DAY TO CORNERS OF THE MOUTH AS NEEDED UNTIL HEALED  3  . pantoprazole (PROTONIX) 40 MG tablet Take 40 mg by mouth daily.    . pregabalin (LYRICA) 75 MG capsule Take 75 mg by mouth 2 (two) times daily.    Marland Kitchen Propylene Glycol-Glycerin 1-0.3 % SOLN Place 1 drop into both eyes daily.     No current  facility-administered medications for this visit.     Review of Systems:  GENERAL:  Chronic fatigue.  No energy.  No fever or chills.  Weight down 8 pounds on a liquid diet. PERFORMANCE STATUS (ECOG):  1 HEENT:  Edenulous.  Thin gums.  Dry eyes and mouth.  No visual changes, runny nose, sore throat, mouth sores or tenderness. Lungs: No shortness of breath.  Np cough.  No hemoptysis.  Denies sleep apnea (feels rested after sleeping). Cardiac:  Chest pain.  No palpitations, orthopnea, or PND. GI:  Not eating well.  No diarrhea, melena or hematochezia. GU:  No urgency, frequency, dysuria, or hematuria.  Heavy menstrual bleeding. Musculoskeletal:  Low back pain.  Joint flares.  No muscle tenderness. Extremities:  No pain or swelling. Skin:  Prurigo nodularis.  Skin flares.  Pulling sensation in skin.  Pruritic.  Areas on scalp not healing.  Hair loss. Neuro:  General weakness.  Headache.  No numbness or weakness, balance or coordination issues. Endocrine:  No diabetes, thyroid issues, or hot flashes.  Psych:  Anxiety about "not being well".  Hypersomnia. Pain: No focal pain. Review of systems:  All other systems reviewed and found to be negative.  Physical Exam: Blood pressure 117/84, pulse 99, temperature 98.3 F (36.8 C), temperature source Tympanic, resp. rate 20, weight 191 lb 2.2 oz (86.7 kg). GENERAL:  Well developed, well nourished, woman sitting comfortably in the exam room in no acute distress. MENTAL STATUS:  Alert and oriented to person, place and time. HEAD:Brown hair pulled up. Normocephalic, atraumatic, face symmetric, no Cushingoid features. EYES:[place]. Pupils equal round and reactive to light and accomodation. No conjunctivitis or scleral icterus. GLO:VFIEPPIRJJ clear without lesion. Tonguenormal. Mucous membranes moist. Edentulous. RESPIRATORY:Clear to auscultationwithout rales, wheezes or rhonchi. CARDIOVASCULAR:Regular rate andrhythmwithout murmur,  rub or gallop. ABDOMEN:Soft, non-tender, with active bowel sounds, and no appreciable hepatosplenomegaly. No masses. SKIN: Scarring on upper extremities.  Several open places.  Make-up covering multiple facial sores. EXTREMITIES: No edema, no skin discoloration or tenderness. No palpable cords. LYMPHNODES: No palpable cervical, supraclavicular, axillary or inguinal adenopathy  NEUROLOGICAL: Unremarkable. PSYCH: Anxious.   Appointment on 02/19/2017  Component Date Value Ref Range Status  . Ferritin 02/19/2017 121  11 - 307 ng/mL Final   Performed at [facility], [address] [place], [place] [address]  . WBC 02/19/2017 9.8  3.6 - 11.0 K/uL Final  . RBC 02/19/2017 4.91  3.80 - 5.20 MIL/uL Final  . Hemoglobin 02/19/2017 14.2  12.0 - 16.0 g/dL Final  . HCT 02/19/2017 41.6  35.0 - 47.0 % Final  . MCV 02/19/2017 84.7  80.0 - 100.0 fL Final  . MCH 02/19/2017 28.9  26.0 - 34.0 pg Final  . MCHC 02/19/2017 34.1  32.0 - 36.0 g/dL Final  . RDW 02/19/2017 14.8*  11.5 - 14.5 % Final  . Platelets 02/19/2017 420  150 - 440 K/uL Final  . Neutrophils Relative % 02/19/2017 56  % Final  . Neutro Abs 02/19/2017 5.6  1.4 - 6.5 K/uL Final  . Lymphocytes Relative 02/19/2017 31  % Final  . Lymphs Abs 02/19/2017 3.1  1.0 - 3.6 K/uL Final  . Monocytes Relative 02/19/2017 9  % Final  . Monocytes Absolute 02/19/2017 0.9  0.2 - 0.9 K/uL Final  . Eosinophils Relative 02/19/2017 3  % Final  . Eosinophils Absolute 02/19/2017 0.2  0 - 0.7 K/uL Final  . Basophils Relative 02/19/2017 1  % Final  . Basophils Absolute 02/19/2017 0.1  0 - 0.1 K/uL Final   Performed at [facility], [address], [place],  [address]    Assessment:  Alison Maxwell is a 42 y.o. female with iron deficiency for at least 2 years.  Initial etiology was felt secondary to menorrhagia.  However, she has had no menses in > 6 months.  Diet is poor.  She denies any melena, hematochezia or  hematuria.  Work-up on 11/11/2015 revealed a hematocrit of 35.2, hemoglobin 10.9, MCV 72.1, platelets 483,000, WBC 12,100 with an ANC of 6900.  Absolute monocyte count was 1100.  B12 was 265 (low normal).  Ferritin was 5, iron saturation of 4% and TIBC of 527 (high) c/w iron deficiency.    Labs on 12/15/2015 revealed a hematocrit 37.5, hemoglobin 11.2, MCV 77.5, platelets 367,000, white count 9300 with an [place] of 4600.  Ferritin was 33 (improved).  Iron studies included a saturation of 11% and a TIBC of 431.  B1 was 141.3 (normal).  Folate was 10.  Methylmalonic acid was 329 (normal) thus ruling out B12 deficiency.  Abdominal ultrasound on 12/15/2015 revealed increased liver echotexture suggesting hepatic steatosis. There were no focal lesions.  Spleen size was normal.  She received Venofer 200 mg IV x 2 (11/11/2015 and 11/25/2015).  She received Venofer 3 (09/06/2016, 09/12/2016, 09/27/2016) and on 01/17/2017.  She states that "the infusions irritated everything".  She became "inflamed". Ferritin goal is > 100.   Ferritin has been followed:  5 on 11/11/2015, 33 on 12/15/2015, 5 on 06/21/2016, 11 on 09/01/2016, 147 on 10/03/2016, 47 on 10/30/2016, 35 on 11/13/2016, 42 on 11/28/2016, 25 on 01/11/2017, and 121 on 02/19/2017.  EGD on 11/17/2015 was normal.  There was a medium amount of food (residue) in the stomach.  Colonoscopy on 11/17/2015 and 08/25/2016 revealed a poor prep.  She is postponing her next colonoscopy until she has full dental implants.  Abdominal ultrasound on 12/15/2015 revealed increased liver echotexture suggesting hepatic steatosis. There were no focal lesions present.  Spleen size was normal.  She has a history of B12 deficiency.  B12 was 265 on 11/11/2015 and 197 (180-914) on 09/01/2016.  MMA was normal on 12/15/2015 and 09/06/2016.  She began B12 monthly on 10/04/2016 (last 02/19/2017).  Intrinsic factor antibody was elevated (8.5) on 01/11/2017 c/w pernicious anemia.   Anti-parietal antibody was normal.  She has been seen by multiple specialists (psychiatry, dermatology, rheumatology, and hematology).  R[facility]umatology evaluation is at [facility] notes possible Sjogren's (+ANA 1:80, ENA screen negative) and fibromyalgia.  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  Review of systems remains diffusely positive.   Symptomatically, she feels "fatigued".  She denies bleeding.  Patient losing weight because of dental issues. She is on a liquid diet.  Plans are to have full  set to dental implants.  Exam is stable.  Hemoglobin 14.2, hematocrit 41.6, and MCV 84.7.  Ferritin is 121.  Plan: 1.  Review labs from 02/19/2017.  CBC and MCV are normal.  Ferritin is 121. 2.  Discuss plan to follow-up with GI for colonoscopy (previoulsy poor prep). 3.  Continue monthly B12 injections (next due 03/21/2017). 4.  Follow up with GYN for evaluation of abnormal uterine bleeding and possible endometriosis.  5.  RTC in 3 months for MD assessment, labs (CBC with diff, ferritin), and +/- Venofer.   Honor Loh, NP 02/21/2017, 2:33 PM   I saw and evaluated the patient, participating in the key portions of the service and reviewing pertinent diagnostic studies and records.  I reviewed the nurse practitioner's note and agree with the findings and the plan.  The assessment and plan were discussed with the patient.  Several questions were asked by the patient and answered.   Nolon Stalls, MD 02/21/2017,2:33 PM

## 2017-02-21 NOTE — Progress Notes (Signed)
Patient states she is sleeping all the time.  She has no energy.  She has no appetite.  States she is pretty much on liquids.  Has had 9# weight loss yesterday.  Also c/o hands and feet staying numb and painful.

## 2017-03-21 ENCOUNTER — Inpatient Hospital Stay: Payer: Medicaid Other

## 2017-04-06 ENCOUNTER — Inpatient Hospital Stay: Payer: Medicaid Other | Attending: Hematology and Oncology

## 2017-04-06 VITALS — BP 122/74 | HR 99 | Temp 98.4°F | Resp 20

## 2017-04-06 DIAGNOSIS — D500 Iron deficiency anemia secondary to blood loss (chronic): Secondary | ICD-10-CM

## 2017-04-06 DIAGNOSIS — E538 Deficiency of other specified B group vitamins: Secondary | ICD-10-CM | POA: Insufficient documentation

## 2017-04-06 MED ORDER — CYANOCOBALAMIN 1000 MCG/ML IJ SOLN
INTRAMUSCULAR | Status: AC
  Filled 2017-04-06: qty 1

## 2017-04-06 MED ORDER — CYANOCOBALAMIN 1000 MCG/ML IJ SOLN
1000.0000 ug | Freq: Once | INTRAMUSCULAR | Status: AC
  Administered 2017-04-06: 1000 ug via INTRAMUSCULAR
  Filled 2017-04-06: qty 1

## 2017-04-16 ENCOUNTER — Inpatient Hospital Stay: Payer: Medicaid Other

## 2017-04-18 ENCOUNTER — Ambulatory Visit: Payer: Self-pay

## 2017-04-18 ENCOUNTER — Ambulatory Visit: Payer: Self-pay | Admitting: Hematology and Oncology

## 2017-05-04 ENCOUNTER — Inpatient Hospital Stay: Payer: Medicaid Other

## 2017-05-07 ENCOUNTER — Inpatient Hospital Stay: Payer: Medicaid Other

## 2017-05-07 ENCOUNTER — Inpatient Hospital Stay: Payer: Medicaid Other | Attending: Hematology and Oncology

## 2017-05-07 VITALS — BP 145/85 | HR 95 | Temp 98.6°F | Resp 20

## 2017-05-07 DIAGNOSIS — D500 Iron deficiency anemia secondary to blood loss (chronic): Secondary | ICD-10-CM

## 2017-05-07 DIAGNOSIS — E538 Deficiency of other specified B group vitamins: Secondary | ICD-10-CM | POA: Diagnosis present

## 2017-05-07 LAB — CBC WITH DIFFERENTIAL/PLATELET
Basophils Absolute: 0 10*3/uL (ref 0–0.1)
Basophils Relative: 0 %
Eosinophils Absolute: 0 10*3/uL (ref 0–0.7)
Eosinophils Relative: 0 %
HCT: 39.1 % (ref 35.0–47.0)
Hemoglobin: 13.1 g/dL (ref 12.0–16.0)
Lymphocytes Relative: 24 %
Lymphs Abs: 2.7 10*3/uL (ref 1.0–3.6)
MCH: 28.9 pg (ref 26.0–34.0)
MCHC: 33.4 g/dL (ref 32.0–36.0)
MCV: 86.5 fL (ref 80.0–100.0)
Monocytes Absolute: 1.5 10*3/uL — ABNORMAL HIGH (ref 0.2–0.9)
Monocytes Relative: 13 %
Neutro Abs: 7 10*3/uL — ABNORMAL HIGH (ref 1.4–6.5)
Neutrophils Relative %: 63 %
Platelets: 432 10*3/uL (ref 150–440)
RBC: 4.52 MIL/uL (ref 3.80–5.20)
RDW: 14.4 % (ref 11.5–14.5)
WBC: 11.3 10*3/uL — ABNORMAL HIGH (ref 3.6–11.0)

## 2017-05-07 LAB — FERRITIN: Ferritin: 80 ng/mL (ref 11–307)

## 2017-05-07 MED ORDER — CYANOCOBALAMIN 1000 MCG/ML IJ SOLN
1000.0000 ug | Freq: Once | INTRAMUSCULAR | Status: AC
  Administered 2017-05-07: 1000 ug via INTRAMUSCULAR

## 2017-05-14 ENCOUNTER — Other Ambulatory Visit: Payer: Medicaid Other

## 2017-05-16 ENCOUNTER — Ambulatory Visit: Payer: Self-pay

## 2017-05-16 ENCOUNTER — Ambulatory Visit: Payer: Self-pay | Admitting: Hematology and Oncology

## 2017-07-19 DIAGNOSIS — K760 Fatty (change of) liver, not elsewhere classified: Secondary | ICD-10-CM | POA: Diagnosis not present

## 2017-07-19 DIAGNOSIS — R1084 Generalized abdominal pain: Secondary | ICD-10-CM | POA: Diagnosis not present

## 2017-07-19 DIAGNOSIS — F411 Generalized anxiety disorder: Secondary | ICD-10-CM | POA: Diagnosis not present

## 2017-07-19 DIAGNOSIS — G5603 Carpal tunnel syndrome, bilateral upper limbs: Secondary | ICD-10-CM | POA: Diagnosis not present

## 2017-07-19 DIAGNOSIS — R7989 Other specified abnormal findings of blood chemistry: Secondary | ICD-10-CM | POA: Diagnosis not present

## 2017-07-19 DIAGNOSIS — Z87898 Personal history of other specified conditions: Secondary | ICD-10-CM | POA: Diagnosis not present

## 2017-07-19 DIAGNOSIS — G629 Polyneuropathy, unspecified: Secondary | ICD-10-CM | POA: Diagnosis not present

## 2017-07-25 DIAGNOSIS — F419 Anxiety disorder, unspecified: Secondary | ICD-10-CM | POA: Diagnosis not present

## 2017-07-25 DIAGNOSIS — G629 Polyneuropathy, unspecified: Secondary | ICD-10-CM | POA: Diagnosis not present

## 2017-07-25 DIAGNOSIS — R946 Abnormal results of thyroid function studies: Secondary | ICD-10-CM | POA: Diagnosis not present

## 2017-07-25 DIAGNOSIS — G5603 Carpal tunnel syndrome, bilateral upper limbs: Secondary | ICD-10-CM | POA: Diagnosis not present

## 2017-07-25 DIAGNOSIS — K760 Fatty (change of) liver, not elsewhere classified: Secondary | ICD-10-CM | POA: Diagnosis not present

## 2017-08-27 DIAGNOSIS — M50322 Other cervical disc degeneration at C5-C6 level: Secondary | ICD-10-CM | POA: Diagnosis not present

## 2017-08-27 DIAGNOSIS — G5603 Carpal tunnel syndrome, bilateral upper limbs: Secondary | ICD-10-CM | POA: Diagnosis not present

## 2017-08-27 DIAGNOSIS — M5136 Other intervertebral disc degeneration, lumbar region: Secondary | ICD-10-CM | POA: Diagnosis not present

## 2017-08-27 DIAGNOSIS — F332 Major depressive disorder, recurrent severe without psychotic features: Secondary | ICD-10-CM | POA: Diagnosis not present

## 2017-08-27 DIAGNOSIS — K760 Fatty (change of) liver, not elsewhere classified: Secondary | ICD-10-CM | POA: Diagnosis not present

## 2017-08-27 DIAGNOSIS — M5431 Sciatica, right side: Secondary | ICD-10-CM | POA: Diagnosis not present

## 2017-08-27 DIAGNOSIS — M545 Low back pain: Secondary | ICD-10-CM | POA: Diagnosis not present

## 2017-08-27 DIAGNOSIS — M542 Cervicalgia: Secondary | ICD-10-CM | POA: Diagnosis not present

## 2017-08-27 DIAGNOSIS — F411 Generalized anxiety disorder: Secondary | ICD-10-CM | POA: Diagnosis not present

## 2017-09-11 DIAGNOSIS — K760 Fatty (change of) liver, not elsewhere classified: Secondary | ICD-10-CM | POA: Diagnosis not present

## 2017-09-11 DIAGNOSIS — E559 Vitamin D deficiency, unspecified: Secondary | ICD-10-CM | POA: Diagnosis not present

## 2017-09-11 DIAGNOSIS — G5603 Carpal tunnel syndrome, bilateral upper limbs: Secondary | ICD-10-CM | POA: Diagnosis not present

## 2017-09-11 DIAGNOSIS — G629 Polyneuropathy, unspecified: Secondary | ICD-10-CM | POA: Diagnosis not present

## 2017-09-11 DIAGNOSIS — F332 Major depressive disorder, recurrent severe without psychotic features: Secondary | ICD-10-CM | POA: Diagnosis not present

## 2018-02-13 DIAGNOSIS — E611 Iron deficiency: Secondary | ICD-10-CM | POA: Diagnosis not present

## 2018-02-13 DIAGNOSIS — E559 Vitamin D deficiency, unspecified: Secondary | ICD-10-CM | POA: Diagnosis not present

## 2018-02-13 DIAGNOSIS — G629 Polyneuropathy, unspecified: Secondary | ICD-10-CM | POA: Diagnosis not present

## 2018-02-13 DIAGNOSIS — Z23 Encounter for immunization: Secondary | ICD-10-CM | POA: Diagnosis not present

## 2018-02-13 DIAGNOSIS — J329 Chronic sinusitis, unspecified: Secondary | ICD-10-CM | POA: Diagnosis not present

## 2018-02-13 DIAGNOSIS — R140 Abdominal distension (gaseous): Secondary | ICD-10-CM | POA: Diagnosis not present

## 2018-02-13 DIAGNOSIS — R05 Cough: Secondary | ICD-10-CM | POA: Diagnosis not present

## 2018-02-13 DIAGNOSIS — J351 Hypertrophy of tonsils: Secondary | ICD-10-CM | POA: Diagnosis not present

## 2018-02-13 DIAGNOSIS — F332 Major depressive disorder, recurrent severe without psychotic features: Secondary | ICD-10-CM | POA: Diagnosis not present

## 2018-02-13 DIAGNOSIS — D649 Anemia, unspecified: Secondary | ICD-10-CM | POA: Diagnosis not present

## 2018-02-14 DIAGNOSIS — R05 Cough: Secondary | ICD-10-CM | POA: Diagnosis not present

## 2018-02-14 DIAGNOSIS — R933 Abnormal findings on diagnostic imaging of other parts of digestive tract: Secondary | ICD-10-CM | POA: Diagnosis not present

## 2018-02-20 DIAGNOSIS — R140 Abdominal distension (gaseous): Secondary | ICD-10-CM | POA: Diagnosis not present

## 2018-02-20 DIAGNOSIS — R933 Abnormal findings on diagnostic imaging of other parts of digestive tract: Secondary | ICD-10-CM | POA: Diagnosis not present

## 2018-02-25 DIAGNOSIS — J329 Chronic sinusitis, unspecified: Secondary | ICD-10-CM | POA: Diagnosis not present

## 2018-02-25 DIAGNOSIS — J351 Hypertrophy of tonsils: Secondary | ICD-10-CM | POA: Diagnosis not present

## 2018-03-07 ENCOUNTER — Encounter: Payer: Self-pay | Admitting: *Deleted

## 2018-03-25 DIAGNOSIS — J329 Chronic sinusitis, unspecified: Secondary | ICD-10-CM | POA: Diagnosis not present

## 2020-03-29 DIAGNOSIS — H5213 Myopia, bilateral: Secondary | ICD-10-CM | POA: Diagnosis not present

## 2020-04-13 DIAGNOSIS — H5213 Myopia, bilateral: Secondary | ICD-10-CM | POA: Diagnosis not present
